# Patient Record
Sex: Male | Born: 1935 | Race: White | Hispanic: No | Marital: Married | Smoking: Former smoker
Health system: Southern US, Community
[De-identification: ages and names within clinical notes are randomized; demographics above are authoritative.]

## PROBLEM LIST (undated history)

## (undated) DIAGNOSIS — C649 Malignant neoplasm of unspecified kidney, except renal pelvis: Secondary | ICD-10-CM

## (undated) DIAGNOSIS — I219 Acute myocardial infarction, unspecified: Secondary | ICD-10-CM

## (undated) DIAGNOSIS — F32A Depression, unspecified: Secondary | ICD-10-CM

## (undated) DIAGNOSIS — K219 Gastro-esophageal reflux disease without esophagitis: Secondary | ICD-10-CM

## (undated) DIAGNOSIS — F0390 Unspecified dementia without behavioral disturbance: Secondary | ICD-10-CM

## (undated) DIAGNOSIS — F329 Major depressive disorder, single episode, unspecified: Secondary | ICD-10-CM

## (undated) HISTORY — PX: APPENDECTOMY: SHX54

## (undated) HISTORY — PX: HEMORRHOID SURGERY: SHX153

## (undated) HISTORY — PX: OTHER SURGICAL HISTORY: SHX169

## (undated) HISTORY — PX: CORONARY STENT PLACEMENT: SHX1402

## (undated) HISTORY — PX: VASECTOMY: SHX75

## (undated) HISTORY — PX: NEPHRECTOMY: SHX65

## (undated) HISTORY — PX: CORONARY ARTERY BYPASS GRAFT: SHX141

---

## 1998-02-25 ENCOUNTER — Encounter (HOSPITAL_COMMUNITY): Admission: RE | Admit: 1998-02-25 | Discharge: 1998-05-26 | Payer: Self-pay | Admitting: Interventional Cardiology

## 1998-05-27 ENCOUNTER — Encounter (HOSPITAL_COMMUNITY): Admission: RE | Admit: 1998-05-27 | Discharge: 1998-08-25 | Payer: Self-pay | Admitting: Interventional Cardiology

## 1998-08-26 ENCOUNTER — Encounter (HOSPITAL_COMMUNITY): Admission: RE | Admit: 1998-08-26 | Discharge: 1998-11-24 | Payer: Self-pay | Admitting: Interventional Cardiology

## 2000-02-15 ENCOUNTER — Encounter: Payer: Self-pay | Admitting: Internal Medicine

## 2000-02-15 ENCOUNTER — Encounter: Admission: RE | Admit: 2000-02-15 | Discharge: 2000-02-15 | Payer: Self-pay | Admitting: Internal Medicine

## 2000-05-13 ENCOUNTER — Encounter: Payer: Self-pay | Admitting: Thoracic Surgery

## 2000-05-13 ENCOUNTER — Encounter: Admission: RE | Admit: 2000-05-13 | Discharge: 2000-05-13 | Payer: Self-pay | Admitting: Thoracic Surgery

## 2002-05-19 ENCOUNTER — Emergency Department (HOSPITAL_COMMUNITY): Admission: EM | Admit: 2002-05-19 | Discharge: 2002-05-19 | Payer: Self-pay | Admitting: Emergency Medicine

## 2003-04-02 ENCOUNTER — Ambulatory Visit (HOSPITAL_COMMUNITY): Admission: RE | Admit: 2003-04-02 | Discharge: 2003-04-02 | Payer: Self-pay | Admitting: Gastroenterology

## 2003-04-02 ENCOUNTER — Encounter (INDEPENDENT_AMBULATORY_CARE_PROVIDER_SITE_OTHER): Payer: Self-pay | Admitting: Specialist

## 2003-07-01 ENCOUNTER — Emergency Department (HOSPITAL_COMMUNITY): Admission: EM | Admit: 2003-07-01 | Discharge: 2003-07-01 | Payer: Self-pay | Admitting: Emergency Medicine

## 2003-08-01 ENCOUNTER — Emergency Department (HOSPITAL_COMMUNITY): Admission: EM | Admit: 2003-08-01 | Discharge: 2003-08-01 | Payer: Self-pay | Admitting: Emergency Medicine

## 2004-04-06 ENCOUNTER — Emergency Department (HOSPITAL_COMMUNITY): Admission: EM | Admit: 2004-04-06 | Discharge: 2004-04-06 | Payer: Self-pay | Admitting: Emergency Medicine

## 2004-09-29 ENCOUNTER — Encounter: Admission: RE | Admit: 2004-09-29 | Discharge: 2004-09-29 | Payer: Self-pay | Admitting: Internal Medicine

## 2004-10-20 ENCOUNTER — Inpatient Hospital Stay (HOSPITAL_COMMUNITY): Admission: EM | Admit: 2004-10-20 | Discharge: 2004-10-24 | Payer: Self-pay | Admitting: Emergency Medicine

## 2004-11-09 ENCOUNTER — Ambulatory Visit (HOSPITAL_COMMUNITY): Admission: RE | Admit: 2004-11-09 | Discharge: 2004-11-10 | Payer: Self-pay | Admitting: Interventional Cardiology

## 2004-11-27 ENCOUNTER — Encounter (HOSPITAL_COMMUNITY): Admission: RE | Admit: 2004-11-27 | Discharge: 2005-02-25 | Payer: Self-pay | Admitting: Interventional Cardiology

## 2005-02-02 ENCOUNTER — Encounter: Admission: RE | Admit: 2005-02-02 | Discharge: 2005-02-02 | Payer: Self-pay | Admitting: Internal Medicine

## 2005-02-06 ENCOUNTER — Encounter: Admission: RE | Admit: 2005-02-06 | Discharge: 2005-02-06 | Payer: Self-pay | Admitting: Internal Medicine

## 2005-11-26 DIAGNOSIS — C649 Malignant neoplasm of unspecified kidney, except renal pelvis: Secondary | ICD-10-CM

## 2005-11-26 HISTORY — DX: Malignant neoplasm of unspecified kidney, except renal pelvis: C64.9

## 2006-01-24 ENCOUNTER — Ambulatory Visit (HOSPITAL_COMMUNITY): Admission: RE | Admit: 2006-01-24 | Discharge: 2006-01-24 | Payer: Self-pay | Admitting: Urology

## 2006-03-11 ENCOUNTER — Inpatient Hospital Stay (HOSPITAL_COMMUNITY): Admission: RE | Admit: 2006-03-11 | Discharge: 2006-03-14 | Payer: Self-pay | Admitting: Urology

## 2006-03-11 ENCOUNTER — Encounter (INDEPENDENT_AMBULATORY_CARE_PROVIDER_SITE_OTHER): Payer: Self-pay | Admitting: Specialist

## 2006-09-09 ENCOUNTER — Ambulatory Visit (HOSPITAL_COMMUNITY): Admission: RE | Admit: 2006-09-09 | Discharge: 2006-09-09 | Payer: Self-pay | Admitting: Urology

## 2007-03-13 ENCOUNTER — Ambulatory Visit (HOSPITAL_COMMUNITY): Admission: RE | Admit: 2007-03-13 | Discharge: 2007-03-13 | Payer: Self-pay | Admitting: Urology

## 2007-08-27 ENCOUNTER — Emergency Department (HOSPITAL_COMMUNITY): Admission: EM | Admit: 2007-08-27 | Discharge: 2007-08-27 | Payer: Self-pay | Admitting: Emergency Medicine

## 2008-01-07 ENCOUNTER — Encounter: Admission: RE | Admit: 2008-01-07 | Discharge: 2008-01-07 | Payer: Self-pay | Admitting: Interventional Cardiology

## 2008-03-19 ENCOUNTER — Ambulatory Visit (HOSPITAL_COMMUNITY): Admission: RE | Admit: 2008-03-19 | Discharge: 2008-03-19 | Payer: Self-pay | Admitting: Urology

## 2008-08-18 ENCOUNTER — Encounter: Admission: RE | Admit: 2008-08-18 | Discharge: 2008-08-18 | Payer: Self-pay | Admitting: Internal Medicine

## 2008-09-22 ENCOUNTER — Ambulatory Visit (HOSPITAL_COMMUNITY): Admission: RE | Admit: 2008-09-22 | Discharge: 2008-09-22 | Payer: Self-pay | Admitting: Urology

## 2008-11-03 ENCOUNTER — Ambulatory Visit (HOSPITAL_COMMUNITY): Admission: RE | Admit: 2008-11-03 | Discharge: 2008-11-04 | Payer: Self-pay | Admitting: Otolaryngology

## 2008-11-03 ENCOUNTER — Encounter (INDEPENDENT_AMBULATORY_CARE_PROVIDER_SITE_OTHER): Payer: Self-pay | Admitting: Otolaryngology

## 2008-12-22 ENCOUNTER — Encounter: Admission: RE | Admit: 2008-12-22 | Discharge: 2008-12-22 | Payer: Self-pay | Admitting: Internal Medicine

## 2010-02-16 ENCOUNTER — Ambulatory Visit (HOSPITAL_COMMUNITY): Admission: RE | Admit: 2010-02-16 | Discharge: 2010-02-16 | Payer: Self-pay | Admitting: Urology

## 2010-02-21 ENCOUNTER — Emergency Department (HOSPITAL_COMMUNITY): Admission: EM | Admit: 2010-02-21 | Discharge: 2010-02-21 | Payer: Self-pay | Admitting: Emergency Medicine

## 2011-04-10 NOTE — Op Note (Signed)
[name], [name]              ACCOUNT NO.:  [id number]   MEDICAL RECORD NO.:  [id number]          PATIENT TYPE:  OIB   LOCATION:  5123                         FACILITY:  [facility]   PHYSICIAN:  [name], M.D. DATE OF BIRTH:  1926/01/02   DATE OF PROCEDURE:  11/03/2008  DATE OF DISCHARGE:                               OPERATIVE REPORT   PREOPERATIVE DIAGNOSIS:  Bilateral frontal sinus mucocele.   POSTOPERATIVE DIAGNOSIS:  Right frontal sinus mucocele.   SURGERY:  Bilateral endoscopic frontal sinus exploration.  Bilateral  endoscopic anterior ethmoidectomy.  Bilateral endoscopic antrostomy.   SURGEON:  Gloris Manchester. Wolicki, MD   ANESTHESIA:  General orotracheal.   BLOOD LOSS:  25 mL   COMPLICATIONS:  None.   FINDINGS:  A slight high anterior leftward septal deviation.  On the  right side, bulging in the anterior agger nasi region which is oriented  on the Stealth apparatus at the site of the mucocele.  Upon opening  this, extraction of approximately 20 mL of mucoid liquid and inspissated  pus.  With the irrigation no additional material produced.  A wide  frontal opening generated on the right side.  On exploration of the left  side, a wide opening into a small aerated frontal sinus pocket which on  Stealth orientation corresponded with the left frontal sinus.  On  careful exploration using a seeker probe, anterior and medial to the  left frontal sinus a small opening was generated into an air containing  space.  I was unable to enlarge this as the bone was quite thick.  Irrigating through the small hole with sterile saline, the fluid  egressed through the sinusotomy on the right side, suggesting that the  apparent bilateral mucoceles were in fact one large frontal sinus  presenting in both the right and left forehead and communicating through  the right frontal recess.  No evident disease in the anterior ethmoids  or maxillary sinuses on either side.  Softening of the  lamina papyracea  on the left side with bulging and orbital compression but no visible  periorbita and absolutely no visible fat.   PROCEDURE:  With the patient in a comfortable supine position, general  orotracheal anesthesia was induced without difficulty.  At an  appropriate level, the table was turned 90 degrees and the patient  placed in a very slight reverse Trendelenburg.  A saline moistened  throat pack was placed.  Nasal vibrissae were trimmed.   The patient had received preoperative Afrin spray.  At this point, 1%  Xylocaine with 1:100,000 epinephrine was infiltrated into the sub  mucoperichondrial plane of the septum on both sides and into the high  lateral wall of the nose.  Additional 1.5 x 3 inch cottonoids were  moistened with Afrin and placed between the turbinates and the septum on  both sides.  A sterile preparation and draping in the midface was  accomplished after implementing the Stealth apparatus.   The materials were removed from the nose and observed to be intact and  correct in number.  Using the headlight, the nose was more thoroughly  inspected.  Xylocaine 1% with 1:100,000 epinephrine, 4 mL total was  infiltrated into the lateral wall of the nose at the middle meatus, and  into the middle turbinate on both sides.  Additional Afrin moistened  pledgets were placed into the middle meatus on each side, and between  the middle turbinate and septum on both sides.  Several additional  minutes were allowed this to take effect.   Once again the materials were removed from the right side of the nose  and observed to be intact and correct in number.  Using a 0 degree nasal  endoscope, the findings were as described above.  The middle turbinate  was gently medialized, the uncinate process was identified, and incised  at its base.  It was lysed partially at the base using a straight punch  forceps and then the uncinate process was removed using the power  debrider.   The anterior face of the bulla ethmoidalis was identified and  opened with a punch and then cleaned further with the debrider.  The  vertical lamella of the middle turbinate was not disturbed.  With  probing, the natural antrostomy was identified and opened posterior and  anterior and the edges trimmed with the power debrider.  This identified  the level of the lamina papyracea.  Dissection bluntly with a suction  tip and with a punch forceps opened some agger nasi cells.  There was a  bulging mucus filled sac in the nasofrontal recess region.  The Stealth  apparatus was oriented and placed in this location and it was observed  to be the frontal sinus.  It was opened.  Some clear mucoid material was  removed following which some relatively adherent inspissated mucus was  evacuated.  The sinus was irrigated with 40 mL of sterile saline.  Bone  chips and mucous membranes were cleaned away from the opening but the  sinus itself was not further implemented.  Using the Stealth curved  suction, attempts were made to decide that this was communicating with  the left forehead and this was not possible owing to the geometry.  With  compression of the orbit, there was no violation of the lamina  papyracea.  This completed the right side.   On the left side, the materials were removed and observed to be intact  and correct in number.  Once again, the turbinate was medialized, the  uncinate process was incised and debrided and the anterior face of the  bulla was opened and then the bulla was debrided using the power  debrider.  The antrostomy was identified and opened posteriorly and  anteriorly and the edges were trimmed with the debrider.  Upon  identifying the lamina papyracea, the dissection was carried forward  into the agger nasi cells and once again there was a wide and easily  identified opening into what appeared to be a frontal sinus chamber.  With Stealth orientation, this was observed to  be the previously aerated  left frontal sinus which was quite asymmetrically smaller.  This did not  enter the apparent opacified left forehead space.  With careful  inspection no opening could be identified.  Using the seeker tip, a  roughly 1-mm opening was identified anterior and medial to the frontal  sinus and it was easily probed with no egress of fluid or mucus.  However, between its location and the dense bone surrounding it, I could  not open it any further.   Using the curved suction tip,  I lodged this at the opening and with  irrigation, egress of the fluid was observed in the right nasofrontal  recess.  This identified the left forehead cavity as being communicating  with the right side and no further attention to this opening was  required.  Small amounts of mucus and bone chips were carefully removed.  The specimen was identified separately for left and right sides.  At  this point, the nasal cavity was suctioned clear on both sides.  Compression of the left orbit identified.  Some mobility of the lamina  papyracea but there was intact bone and no evidence of herniated fat.   A triple thickness Telfa pack impregnated with bacitracin ointment and  with a 2-0 silk tag was placed in the middle meatus on each side for  hemostasis.  The nose and pharynx were suctioned clear.  The patient was  cleaned and drip pad was applied.  The patient was returned to  Anesthesia, awakened, extubated, and transferred to recovery in stable  condition.   COMMENT:  An 77 year old white male with persistent frontal sinus  opacification headaches, but no significant nasal drainage was  indication for today's procedure.  Anticipate routine postoperative  recovery with pack removal at 1 day and institution of nasal hygiene  measures.  Given his age and some preexisting cardiac conditions, we  will observe him 23 hours extended recovery.      Gloris Manchester. Lazarus Sutton, M.D.  Electronically  Signed     KTW/MEDQ  D:  11/03/2008  T:  11/03/2008  Job:  [id number]   cc:   Theressa Millard, M.D.  Lyn Records, M.D.

## 2011-04-13 NOTE — H&P (Signed)
NAMEMAYFORD, ALBERG              ACCOUNT NO.:  [id number]   MEDICAL RECORD NO.:  [id number]          PATIENT TYPE:  INP   LOCATION:  1419                         FACILITY:  [facility]   PHYSICIAN:  Heloise Purpura, MD      DATE OF BIRTH:  1926-08-26   DATE OF ADMISSION:  03/11/2006  DATE OF DISCHARGE:                                HISTORY & PHYSICAL   CHIEF COMPLAINT:  Right renal mass.   HISTORY:  [name] is a 77 year old gentleman who was found to have an  incidentally detected 1.2 cm right renal mass on a chest CT scan during a  hospitalization for a cardiac event.  He subsequently recovered from his  hospitalization and his performance status did significantly improve.  On  followup surveillance imaging, this mass was noted to increase in size to 2-  3 cm.  It was clearly seen to be enhancing and worrisome for a possible  renal malignancy.  A complete metastatic evaluation was performed which was  negative.  Due to the interval growth in the size of the mass, the patient  was counseled regarding management options and did wish to proceed with  surgical removal.  He therefore underwent a complete cardiac evaluation by  his cardiologist, Dr. Garnette Scheuermann and was felt to be an acceptable surgical  risk.  Surgical options were discussed with the patient and he did elect to  proceed with a laparoscopic partial nephrectomy.   PAST MEDICAL HISTORY:  Coronary artery disease status post myocardial  infarction in 1991.  He did have intervention with both angioplasty and  cardiac stents in 2005.  He also had a coronary artery bypass graft in 1997  as well as another angioplasty in 1991.  In addition, he has a history of  osteoarthritis, irritable bowel syndrome, and a history of a basal cell skin  cancer.   PAST SURGICAL HISTORY:  1.  Coronary artery bypass grafting in 1997.  2.  Hemorrhoidectomy.  3.  Laparoscopic appendectomy.  4.  Nasoplasty.   MEDICATIONS:  Benicar, Aciphex, and  Prozac.   ALLERGIES:  NO KNOWN DRUG ALLERGIES.   FAMILY HISTORY:  No history of GU malignancy.   SOCIAL HISTORY:  The patient is a retired [employer] fund[employer].  He is married.  He did  smoke one pack of cigarettes per day but quit some time ago.   PHYSICAL EXAMINATION:  CONSTITUTIONAL:  The patient is a well-nourished and  well-developed age-appropriate male in no acute distress.  CARDIOVASCULAR:  Regular rate and rhythm with some interval dropped beats.  No obvious murmurs.  LUNGS:  Clear bilaterally.  ABDOMEN:  Soft, nontender, nondistended, without abdominal masses.  BACK:  No CVA tenderness.  EXTREMITIES:  No edema.   IMPRESSION:  Right renal mass worrisome for malignancy.   PLAN:  [name] will be taken to the operating room and will undergo a  planned laparoscopic partial nephrectomy.  He then will be admitted to the  hospital for routine postoperative care.           ______________________________  Heloise Purpura, MD  Electronically Signed  LB/MEDQ  D:  03/11/2006  T:  03/11/2006  Job:  [id number]

## 2011-04-13 NOTE — Discharge Summary (Signed)
[name], [name]              ACCOUNT NO.:  [id number]   MEDICAL RECORD NO.:  [id number]          PATIENT TYPE:  INP   LOCATION:  1419                         FACILITY:  [facility]   PHYSICIAN:  Heloise Purpura, MD      DATE OF BIRTH:  Jan 11, 1926   DATE OF ADMISSION:  03/11/2006  DATE OF DISCHARGE:  03/14/2006                                 DISCHARGE SUMMARY   ADMISSION DIAGNOSIS:  Right renal mass.   DISCHARGE DIAGNOSIS:  Clear-cell renal cell carcinoma.   HISTORY AND PHYSICAL:  For full details, please see admission history and  physical. Briefly, Mr. Cafiero is a 77 year old gentleman who was found to  have an incidentally detected enhancing right renal mass. This was  subsequently seen to be increasing in size on follow-up imaging. The patient  initially was hospitalized due to his cardiac disease. Subsequently, his  performed status improved, and he was felt to be a potential surgical  candidate. After undergoing a negative metastatic evaluation and a  discussion regarding management options for a clinically localized enhancing  renal mass, the patient elected to proceed with surgical therapy. He did  undergo a cardiac evaluation by Dr. Garnette Scheuermann which was determined to place  the patient as an acceptable risk for surgical treatment of his renal mass.   HOSPITAL COURSE:  The patient was admitted to the hospital on March 11, 2006. He then underwent a laparoscopic right radical nephrectomy. Initially,  the plan was to proceed with a partial nephrectomy. However, due to the  patient's labile blood pressure intraoperatively and very dense adherent  perinephric fat, it was decided to proceed with a radical nephrectomy as  this was felt to be the safest option for the patient. The procedure was  otherwise uneventful, and he was able to be transferred to a regular  hospital room following recovery from anesthesia. Over the course of the  next couple of days, he was able to begin  ambulating which he did without  difficulty. He was maintained on a cardiac monitor during his hospital stay.  He did not have any cardiac events and specifically denied chest pain and  had no evidence of arrhythmias on his telemetry monitoring. Over the course  of the first few postoperative days, his diet was able to be gradually  advanced. He did have return of bowel function. By postoperative day #3, he  was tolerating a regular diet and able to ambulate without difficulty. His  pain was well controlled on oral pain medication. His serum creatinine on  postoperative day #3 was 1.4. By the morning of postoperative day #3, he had  met all discharge criteria and was discharged home in excellent condition.   DISPOSITION:  Home.   DISCHARGE MEDICATIONS:  The patient was instructed to resume his regular  home medications. In addition, he was maintained on Lopressor  postoperatively. He was given a prescription to continue this medication at  12.5 mg p.o. b.i.d. He was also given a prescription for Vicodin as needed  for pain and told to take Colace as a stool softener.   DISCHARGE  INSTRUCTIONS:  The patient was instructed to be ambulatory but  specifically instructed to refrain from any heavy lifting or strenuous  activity for a period of six weeks. He was told that he should not drive for  approximately one month. In addition, he was instructed on the signs and  symptoms of wound infection and told to call should he have any problems.   FOLLOW UP:  Mr. Tino will follow up with me in the next 10-14 days for  removal of the staples and for a postoperative checkup. I have also  instructed the patient to schedule an appointment with Dr. Garnette Scheuermann within  the next two months to determine whether he should continue his beta blocker  or if he may discontinue this medication at that time.           ______________________________  Heloise Purpura, MD  Electronically Signed     LB/MEDQ   D:  03/14/2006  T:  03/15/2006  Job:  [id number]   cc:   Lyn Records, M.D.  Fax: [phone]

## 2011-04-13 NOTE — Op Note (Signed)
   [name], POTASH                          ACCOUNT NO.:  [id number]   MEDICAL RECORD NO.:  [id number]                   PATIENT TYPE:  AMB   LOCATION:  ENDO                                 FACILITY:  [facility]   PHYSICIAN:  Danise Edge, M.D.                DATE OF BIRTH:  1926/06/13   DATE OF PROCEDURE:  04/02/2003  DATE OF DISCHARGE:  04/02/2003                                 OPERATIVE REPORT   REFERRING PHYSICIAN:  Theressa Millard, M.D.   INDICATIONS FOR PROCEDURE:  The patient is a 77 year old male born on  November 11, 1926.  The patient has alternating constipation leading to  nonbloody diarrhea. He is scheduled to undergo a diagnostic and also  screening colonoscopy with polypectomy to prevent colon cancer.   ENDOSCOPIST:  Danise Edge, M.D.   PREMEDICATION:  Versed 10 mg, Demerol 70 mg.   DESCRIPTION OF PROCEDURE:  After obtaining informed consent, the patient was  placed in the left lateral decubitus position.  I administered intravenous  Demerol and intravenous Versed to achieve conscious sedation for the  procedure.  The patient's blood pressure, oxygen saturation, and cardiac  rhythm were monitored throughout the procedure and documented in the medical  record.   Anal inspection was normal.  Digital rectal examination was normal.  The  Olympus adult colonoscope was introduced into the rectum and advanced to the  cecum. Colonic preparation for the examination today was satisfactory.   Rectum normal.   Sigmoid colon and descending colon:  At 40 cm from the anal verge, a 1 mm  sessile polyp was removed with the hot biopsy forceps.   Splenic flexure normal.   Transverse colon normal.   Hepatic flexure normal.   Ascending colon normal.   Cecum and ileocecal valve normal.    ASSESSMENT:  From the sigmoid colon at 40 cm in the anal verge, a 1 mm  sessile polyp was removed with the hot biopsy forceps; otherwise normal  screening proctocolonoscopy to the  cecum.                                               Danise Edge, M.D.    MJ/MEDQ  D:  04/02/2003  T:  04/05/2003  Job:  [id number]   cc:   Theressa Millard, M.D.  [address] [place]  [place] [address]  Fax: [phone]

## 2011-04-13 NOTE — H&P (Signed)
NAMEJARRYD, Sutton              ACCOUNT NO.:  [id number]   MEDICAL RECORD NO.:  [id number]          PATIENT TYPE:  EMS   LOCATION:  MAJO                         FACILITY:  [facility]   PHYSICIAN:  Ulyses Amor, MD DATE OF BIRTH:  July 04, 1926   DATE OF ADMISSION:  10/20/2004  DATE OF DISCHARGE:                                HISTORY & PHYSICAL   HISTORY OF PRESENT ILLNESS:  [name] is a 77 year old white man  who is admitted to [facility] for further evaluation of chest pain.   The patient has a history of cardiac disease which dates back to 67.  At  that time, he suffered a myocardial infarction and underwent coronary artery  bypass surgery.  His course has been uncomplicated since then.  He has not  experienced recurrent angina.   The patient presented to the emergency department after experiencing an  episode of chest pain.  This occurred at home while he was seated.  The  chest pain was described as a pressure and an ache in the lower substernal  region.  It did not radiate.  It was not associated with dyspnea,  diaphoresis or nausea.  There were no exacerbating or ameliorating factors.  It appeared not be related to position, activity, meals or respirations.  He  took two nitroglycerin tablets at home which did not relieve the chest pain.  EMS was summoned, and he was transported to the emergency department.  He  was given additional nitroglycerin en route.  His chest pain had largely  resolved by the time that he had arrived in the emergency department.  He is  free of chest pain and otherwise asymptomatic at this time.   There is no history of congestive heart failure.  The patient is known to  have an ejection fraction of approximately 40% by echocardiogram.  There is  no history of arrhythmia.   There is no history of hypertension, diabetes mellitus or dyslipidemia.  The  patient smoked in the remote past.  There is a family history of early  coronary artery disease.  His father suffered from coronary artery disease  in his early 58's.   In addition to the aforementioned problems, the patient has a history of  gastroesophageal reflux disease and depression.   CURRENT MEDICATIONS:  1.  Aciphex 20 mg p.o. daily.  2.  Fluoxetine 20 mg p.o. daily.  3.  Accupril 20 mg daily.  4.  Aspirin 325 mg p.o. daily.   ALLERGIES:  He is not allergic to any medications.   OPERATIONS:  Coronary artery bypass surgery as noted above, as well as  appendectomy.   SIGNIFICANT INJURIES:  None.   SOCIAL HISTORY:  The patient is retired. He lives with his wife. He never  smokes nor drinks.   REVIEW OF SYSTEMS:  Reveals no new problems related to his head, eyes, ears,  nose, mouth, throat, lungs, gastrointestinal system, genitourinary system or  extremities.  There is no history of neurological or psychiatric disorder.  There is no history of fever, chills, or weight loss.   PHYSICAL EXAMINATION:  VITAL SIGNS:  Blood pressure 118/61.  Pulse 84 and  regular.  Respirations 20.  Temperature 98.6.  GENERAL:  The patient is a middle-aged white man, in no discomfort.  He was  alert, oriented, appropriate, and responsive.  HEENT:  Head, eyes, nose and mouth were normal.  NECK:  Without thyromegaly or adenopathy.  Carotid pulses were palpable  bilaterally without bruits.  CARDIAC:  Examination revealed a normal S1 and S2.  There was no S3, S4,  murmur, rub, or click.  Cardiac rhythm was regular.  No chest wall  tenderness was noted.  LUNGS:  Clear.  ABDOMEN:  Soft, nontender.  There was no mass, hepatosplenomegaly, bruits,  distension, rebound, guarding, or rigidity.  Bowel sounds were normal.  RECTAL/GENITAL:  Examinations were not performed as they were not pertinent  to the reason for acute-care hospitalization.  EXTREMITIES:  Without edema, deviation or deformity.  Radial and dorsalis  pedis pulses were palpable bilaterally.  NEUROLOGIC:   Brief screening neurologic survey was unremarkable.   Electrocardiogram demonstrates normal sinus rhythm, a prior septal  infarction, and nonspecific ST-T abnormalities in the lateral leads.   The chest radiograph, according to the radiologist, demonstrated  cardiomegaly with evidence of prior coronary artery bypass surgery.  White  count was 7.4 with hemoglobin of 14.3 and hematocrit of 41.2.  BUN was 16,  creatinine 0.7 and potassium 4.2.  The initial set of cardiac markers  revealed a myoglobin of 53.5, CK-MB of 2.5, and troponin less than 0.05.   IMPRESSION:  1.  Chest pain, rule out unstable angina.  2.  Coronary artery disease, status post myocardial infarction and coronary      artery bypass surgery in 1997.  3.  Mild left ventricular dysfunction without congestive heart failure.      Ejection fraction estimated to be approximately 40% by echocardiogram.  4.  Gastroesophageal reflux.  5.  Depression.   PLAN:  1.  Telemetry.  2.  Serial cardiac enzymes.  3.  Aspirin.  4.  Lovenox.  5.  Intravenous nitroglycerin.  6.  Metoprolol.  7.  Further measures per Dr. Katrinka Blazing      [facility]   MSC/MEDQ  D:  10/20/2004  T:  10/20/2004  Job:  [id number]   cc:   Lesleigh Noe, M.D.  [address]  [place]  [place] [address]  Fax: [phone]

## 2011-04-13 NOTE — Cardiovascular Report (Signed)
NAMEROLIN, SCHULT              ACCOUNT NO.:  [id number]   MEDICAL RECORD NO.:  [id number]          PATIENT TYPE:  OIB   LOCATION:  2899                         FACILITY:  [facility]   PHYSICIAN:  Lyn Records III, M.D.DATE OF BIRTH:  October 12, 1926   DATE OF PROCEDURE:  11/09/2004  DATE OF DISCHARGE:                              CARDIAC CATHETERIZATION   INDICATIONS:  High grade native RCA disease in the proximal mid and distal  vessel with tight PDA lesion proximal to PDA saphenous vein graft insertion  site leaving the LV branches of the right coronary threatened.   PROCEDURE PERFORMED:  1.  Taxus stent implantation in the proximal/mid left anterior descending      (one long stent).  2.  Distal right coronary artery Taxus stent deployment.   DESCRIPTION:  The patient was brought to the catheterization laboratory in  the postabsorptive state.  A 7-French sheath was started in the right  femoral artery using the modified Seldinger technique.  A 7-French side hole  guide catheter was used to obtain guiding shots.  Angiomax was used as the  antithrombotic.  The patient was already on Plavix from the previously  placed stent in the PDA via the saphenous vein graft to the PDA.  Angioplasty was performed over a medium Asahi wire.  A 2.5 x 15 mm long  Maverick balloon was used for pre dilatation of the proximal and mid RCA and  in using this balloon it was felt that a 30 mm segment of stent was needed  to cover the disease in the proximal and mid RCA.  We therefore deployed a  3.0 x 32 mm long Taxus stent to 14 atmospheres.  Two balloon inflations were  performed and a good result was achieved.  This was after we had directly  stented the distal RCA with a 2.75 x 16 Taxus stent to 11 atmospheres.  Two  balloon inflations were performed at that site and a nice angiographic  result was noted.  We then removed the wire and did final angiographic  imaging demonstrating widely patent distal RCA,  widely patent proximal and  mid RCA, this step-up proximally noted.  TIMI grade 3 flow is noted.  Reflux  into the PDA and saphenous vein graft is also noted.  AngioSeal arteriotomy  closure was performed using an 8-French device with good hemostasis.  Angiomax was discontinued in the catheterization laboratory.  The ACT prior  to intervention was greater than 300.   CONCLUSION:  Successful deployment of a 32 mm x 3.0 mm Taxus stent in the  proximal/mid right coronary artery and a 16 x 2.75 Taxus stent in the distal  right coronary artery with wide patency of native right coronary freeing  this territory of potential ischemia due to the high grade obstruction in  the posterior descending artery that supplies the native right coronary  retrograde via flow from the saphenous vein graft to the posterior  descending artery.   PLAN:  1.  Discharge November 10, 2004.  2.  Continue Plavix x1 year.       HWS/MEDQ  D:  11/09/2004  T:  11/09/2004  Job:  [id number]   cc:   Theressa Millard, M.D.  [address] [place]  [place] [address]  Fax: [phone]

## 2011-04-13 NOTE — Op Note (Signed)
NAMEPHILLIPE, [name]              ACCOUNT NO.:  [id number]   MEDICAL RECORD NO.:  [id number]          PATIENT TYPE:  INP   LOCATION:  1419                         FACILITY:  [facility]   PHYSICIAN:  Heloise Purpura, MD      DATE OF BIRTH:  Jan 04, 1926   DATE OF PROCEDURE:  03/11/2006  DATE OF DISCHARGE:                                 OPERATIVE REPORT   PREOPERATIVE DIAGNOSIS:  Right renal mass.   POSTOPERATIVE DIAGNOSIS:  Right renal mass.   PROCEDURE:  Right laparoscopic radical nephrectomy.   SURGEON:  Dr. Heloise Purpura.   ASSISTANT:  Dr. Marcine Matar   ANESTHESIA:  General.   COMPLICATIONS:  None.   ESTIMATED BLOOD LOSS:  250 mL.   INTRAVENOUS FLUIDS:  3500 mL of lactated Ringer's.   SPECIMEN:  Right kidney.   DRAINS:  16-French Foley catheter.   INDICATIONS:  [name] is a 77 year old patient who was found to have an  incidentally detected enhancing right renal mass worrisome for possible  malignancy approximately 1 year ago.  Initially this mass noted be 1.2 cm.  The patient subsequently has improved his overall performance status and  recovered from a cardiac standpoint.  In addition, his renal mass was found  to increase incised from 1.2 cm to between 2 and 3 cm on follow-up imaging.  Metastatic evaluation was performed which was negative.  After discussion  regarding management options for a enhancing renal mass, the patient elected  to proceed with surgical removal.  We did discuss performing a laparoscopic  partial nephrectomy which the patient did consent.  We discussed the risks  and benefits including the potential need for total nephrectomy and the  patient consented and agreed to proceed.   DESCRIPTION OF PROCEDURE:  The patient was taken to the operating room and  general anesthetic was administered.  He was given preoperative antibiotics,  placed in the modified right flank position, and prepped and draped in usual  sterile fashion.  A preoperative  time-out was performed.  A 10 mm Hasson  port was then placed.  Entry into the peritoneal cavity was performed via a  standard open Hasson technique.  0 Vicryl holding stitches were then placed  in the abdominal wall fascia which were secured to the 10 mm Hasson port.  A  pneumoperitoneum was established and the 30 degrees lens was then used to  inspect the abdomen.  There was no evidence of any intra-abdominal injuries.  There were noted to be a few small adhesions in the right upper quadrant.  The remaining ports were then placed.  A 12 mm port was placed between the  umbilicus and the xiphoid process just to the right of midline.  An  additional 12 mm port was placed in the right lower quadrant just lateral to  the rectus muscle.  The harmonic scalpel was then used to take down the  previously mentioned adhesions.  Finally, a 5 mm port was placed in the far  lateral right abdominal wall.  All ports were placed under direct vision  without difficulty.  The harmonic scalpel was  then used to mobilize the  ascending colon medially and the plane between the colonic mesentery and the  anterior layer of Gerota's fascia was developed.  The duodenum was then  identified and a Kocher maneuver was used to medially mobilize the duodenum  thereby exposing the inferior vena cava.  The space between the psoas fascia  and the overlying tissue which included the gonadal vein and ureter was then  developed allowing these structures to be lifted anteriorly.  Attention then  turned to the lateral aspect of the kidney which was dissected free from the  abdominal sidewall.  Superiorly, the fat overlying the kidney was noted to  be densely adherent to the liver.  This was carefully taken down.  During  this dissection, there was noted to be a small superficial laceration to the  liver.  This was not bleeding and did not require any immediate attention.  The kidney was then further mobilized.  Dissection then  continued from the  inferior aspect of the kidney toward the renal hilum along the inferior vena  cava.  A small renal artery was seen just inferior to the main renal vein.  There was also noted to be an additional renal artery just posterior to the  renal vein.  Then attention then turned to the anterior aspect of the  kidney.  The perinephric fat was incised with the harmonic scalpel.  This  fat was noted to be extremely dense and adherent to the renal parenchyma.  While this fat was able to be cleared off the renal capsule the inferior  aspect of the kidney, this did take an extended period of time.  Dissection  continued superiorly.  During the case, the patient's blood pressure was  noted to be somewhat labile and it was decided that for the safety of the  patient, and it would be best to proceed with a total nephrectomy rather  than to continue with a dissection of the perinephric fat off the kidney  which was extremely tedious and time-consuming.  The patient was noted to  have a normal contralateral kidney on his CT scan.  Therefore, the  previously mentioned renal arteries were identified and divided between  multiple hem-o-lock clips.  Similarly, the renal vein was divided between  multiple large hem-o-lok clips and divided.  The superior pole of the kidney  was then dissected free from the adrenal gland after Gerota's fascia was  entered.  There was noted to be a large vein superiorly that appeared to be  going from the kidney up toward the adrenal gland.  This was prospectively  identified and divided between hem-o-lock clips.  In addition, the ureter  and gonadal vein were isolated and divided between hem-o-lock clips.  The  remaining attachments of the kidney were then ligated with the Harmonic  scalpel and divided and the kidney was placed up onto the liver.  The renal fossa was then carefully examined.  There did not appear to be any bleeding  and hemostasis appeared  excellent.  FloSeal was then placed onto the  previously mentioned liver laceration and Surgicel was placed over this  area.  Attention then turned to placement of the closing stitches.  0 Vicryl  stitches were placed through the abdominal wall fascia of both the midline  superior and right lower quadrant, 12 mm port sites for later port site  closure.  The EndoCatch II bag was then placed through the port site just  superior to the umbilicus  after this after the [facility] port was removed.  The  kidney specimen was placed in this bag and this incision was extended in a  periumbilical fashion allowing the specimen to be removed intact within the  EndoCatch II bag.  This fascial opening was then closed with two running 0  Vicryl sutures.  The pneumoperitoneum was then reestablished and the right  renal fossa examined.  There did not appear to be any additional bleeding  and again appeared to be excellent hemostasis both in the renal fossa as  well as on the liver.  The 5 mm port was then removed.  The remaining ports  were then removed under direct vision.  The previously placed 0 Vicryl  sutures were then used to close the 12 mm port sites.  Quarter percent  Marcaine was used to injected into all incision sites which were  reapproximated at the skin with staples.  Sterile dressings were applied.  There were no complications.  The patient appeared to tolerate the procedure  well.  All sponge, needle counts were correct x2.  He was able to be  extubated and transferred to recovery in satisfactory condition.           ______________________________  Heloise Purpura, MD  Electronically Signed     LB/MEDQ  D:  03/11/2006  T:  03/12/2006  Job:  [id number]

## 2011-04-13 NOTE — Cardiovascular Report (Signed)
[name], [name]              ACCOUNT NO.:  [id number]   MEDICAL RECORD NO.:  [id number]          PATIENT TYPE:  OBV   LOCATION:  2018                         FACILITY:  [facility]   PHYSICIAN:  Lyn Records III, M.D.DATE OF BIRTH:  [birth year]   DATE OF PROCEDURE:  10/23/2004  DATE OF DISCHARGE:                              CARDIAC CATHETERIZATION   PROCEDURE:  Diagnostic left heart catheterization with selective left and  right coronary angiography, bypass graft angiography, left internal mammary  artery graft angiography, stent of the posterior descending coronary artery  via the saphenous vein graft to the posterior descending coronary artery  using a drug-eluting stent.   CARDIOLOGIST:  Lyn Records, M.D.   INDICATIONS FOR PROCEDURE:  Acute coronary syndrome, prior history of a  coronary artery bypass graft surgery.   DESCRIPTION OF PROCEDURE:  The patient was brought to the cardiac  catheterization laboratory after being admitted on October 20, 2004, for  symptoms compatible with unstable angina.  Biochemical markers were negative  for a myocardial infarction.  Diagnostic cardiac catheterization was felt to  be indicated, and was performed after the patient's consent.  After performing a coronary angiography, high-grade stenosis was identified  distal to the saphenous vein graft origin to the PDA.  This was felt to be a  potential cover for the patient's sudden onset and symptoms, perhaps with  plaque rupture, partial thrombosis and recannulation.  After discussing  potential percutaneous approaches with the patient, we decided to proceed.  The patient was given a total of 5500 units of IV heparin and a double bolus  followed by an infusion of Integrilin.  The ACT was documented to be greater  than 220 seconds.  We then used a 6-French right bypass graft guide catheter  and a BMW wire to perform the intervention.  We used a 2.0 mm Voyager  balloon, and then stented with  a 2.5 mm x 18.0 mm CYPHER.  Post-dilatation  was then performed with a 9 mm long Quantum 3.0 mm diameter balloon.  The  patient tolerated the procedure without significant complications.  He did  have substernal chest discomfort that was qualitatively different than that  which precipitated admission.  Post-procedure and angiogram, a sheathogram  was performed in the right femoral, and angiocele arteriotomy closure was  performed without complications.   RESULTS:  HEMODYNAMIC DATA  Aortic pressure:  108/60.  Left ventricular pressure:  113/15.  LEFT VENTRICULOGRAPHY  The left ventricle demonstrates left ventricular dysfunction.  Anteroapical  moderate to severe hypokinesis was noted.  The estimated ejection fraction  is in the 35%-45% range, closer to 45%.  No MR is noted.  CORONARY ANGIOGRAPHY  1.  LEFT MAIN CORONARY ARTERY:  The left main coronary artery has a proximal      25% narrowing, distal 30%-40% narrowing.  Calcification is noted.  2.  LEFT ANTERIOR DESCENDING CORONARY ARTERY:  The left anterior descending      coronary artery is occluded after the second diagonal and first septal      perforator.  3.  CIRCUMFLEX CORONARY ARTERY:  The circumflex coronary  artery is totally      occluded in the mid-vessel after the left atrial recurrent branch.  4.  RIGHT CORONARY ARTERY:  The native right coronary is patent, but      contains proximal/ostial 90% narrowing, mid-vessel 85%-90% narrowing and      distal 90%.  Competition for flow is noted in the continuation of the      RCA and also into the proximal third of the PDA.  BYPASS GRAFT ANGIOGRAPHY  1.  SAPHENOUS VEIN GRAFT TO THE OBTUSE MARGINALS:  This graft is widely      patent, but contains an eccentric 50% distal anastomosis stenosis.  2.  SAPHENOUS VEIN GRAFT TO THE DIAGONAL:  The saphenous vein graft to the      diagonal is patent.  A distal 25%-30% anastomosis stenosis is noted.  3.  SAPHENOUS VEIN GRAFT TO THE RIGHT  CORONARY ARTERY:  This graft is widely      patent.  The PDA contains an eccentric 90+% stenosis just distal to the      graft insertion site.  4.  LEFT INTERNAL MAMMARY GRAFT TO THE LEFT ANTERIOR DESCENDING CORONARY      ARTERY:  The subclavian contains a significant kink in the mid-vessel.      The LIMA is patent, but could not be directly cannulated.  No      obstruction is noted in the LIMA, and there is good distal runoff into      the mid to distal LAD without any evidence of significant obstruction.  PERCUTANEOUS CORONARY INTERVENTION:  The PDA distal to the graft insertion  site was pre-dilated, and then an 18 mm long x 2.5 mm wide CYPHER stent was  easily deployed to 13 atmospheres.  Pulse dilatation was performed with a 9  mm long x 3.0 mm wide Quantum balloon to 12 atmospheres in the proximal  third to one-half of the stent.  The patient tolerated the procedure, but  did have reproducible pain that did not completely mimic that which caused  admission.  Post-procedure there was continued good reflux of contrast into  the proximal PDA, and then to the continuation of the RCA.   CONCLUSION:  1.  Severe native vessel disease with a total occlusion of the left anterior      descending coronary artery and the circumflex, as outlined above.  There      are multiple high-grade lesions in the native right coronary artery.      The posterior descending coronary artery beyond the graft insertion site      is threatened with a 90+% stenosis beyond the graft insertion site, into      the proximal third of the posterior descending coronary artery.  2.  Left ventricular dysfunction with anteroapical moderate to severe      hypokinesis and an ejection fraction in the 35% to 45% range.  No mitral      regurgitation.  3.  Saphenous vein graft is widely patent.  There is a 50% distal      anastomosis stenosis in the saphenous vein graft to the obtuse marginal. 4.  Successful stent of the  posterior descending coronary artery via the      saphenous vein graft to the posterior descending coronary artery with      deployment of a CYPHER 2.5 mm x 18.0 mm stent to 0% stenosis with TIMI      grade 3 flow.  5.  Patent left internal mammary  artery to the left anterior descending      coronary artery graft, significant kinking and calcification in the left      subclavian preventing ostial engagement.   PLAN:  Aspirin and Plavix.  Consideration of deployment of a long stent in  the proximal/mid-native RCA and a separate more distal stent in the distal  RCA, to free up the circulation to the LV branches of the right coronary  artery, which are still undisturbed because of a 70%-80% stenosis in the  proximal PDA, proximal to the graft insertion site, and severe native vessel  disease, as outlined above.  Will discuss this with the patient, and may  consider doing this in one to two weeks, if all goes well.       HWS/MEDQ  D:  10/23/2004  T:  10/23/2004  Job:  [id number]   cc:   Theressa Millard, M.D.  [address] [place]  [place] [address]  Fax: [phone]

## 2011-07-04 IMAGING — CT CT HEAD W/O CM
2 series · 17 of 30 positions shown, 20 images · non-contrast
Comparison: Plain film of the nasal bone of 08/27/2007.

CLINICAL DATA: Fall with laceration to right orbit/nasal area.

CT HEAD WITHOUT CONTRAST
TECHNIQUE: Contiguous axial images were obtained from the base of
the skull through the vertex without contrast.

[Series 2: head_seq 4.5 h37s st · axial · 0.46mm/px · z∈[-100,+29]mm · 10 of 36 slices shown, 13 images]
[im 4/36  brain]
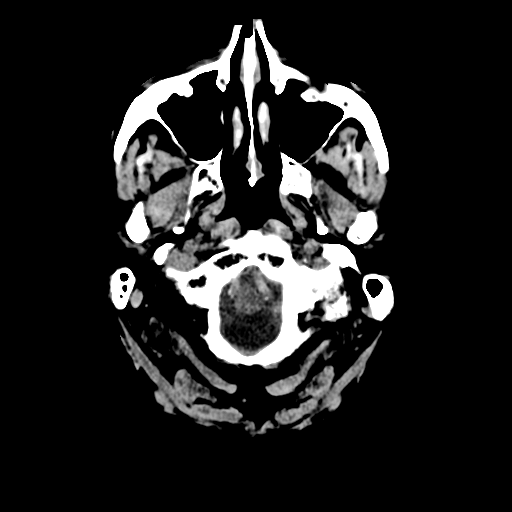
[im 4/36  bone]
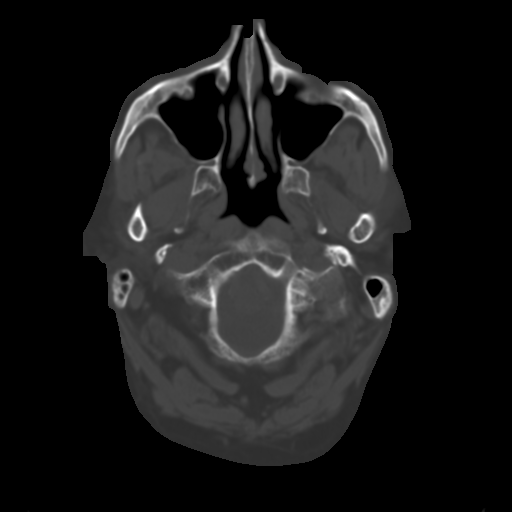
[im 7/36  brain]
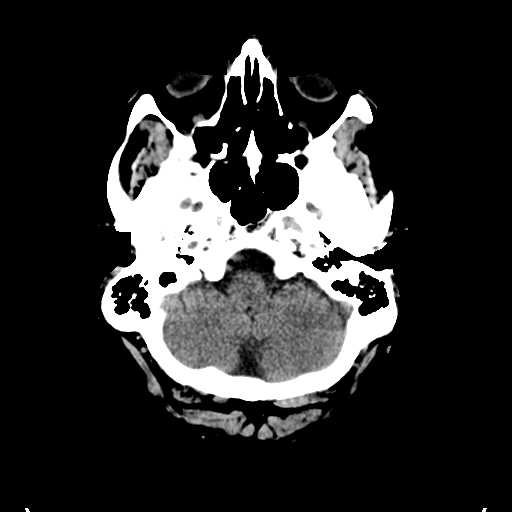
[im 10/36  brain]
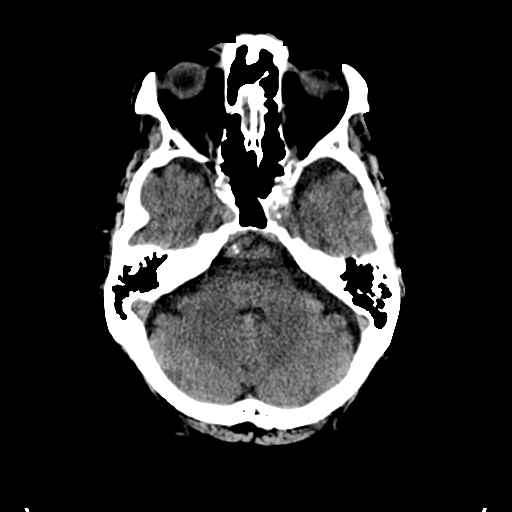
[im 13/36  brain]
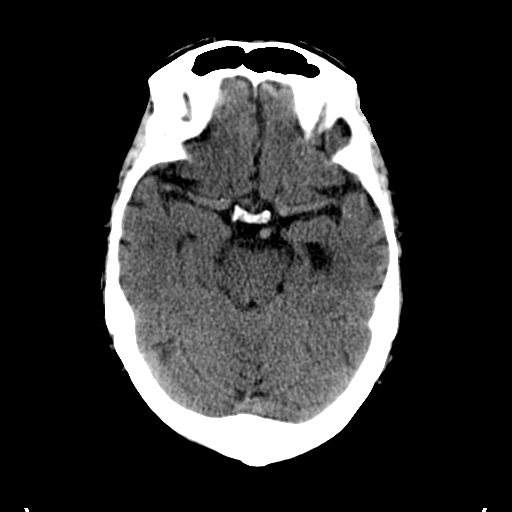
[im 16/36  brain]
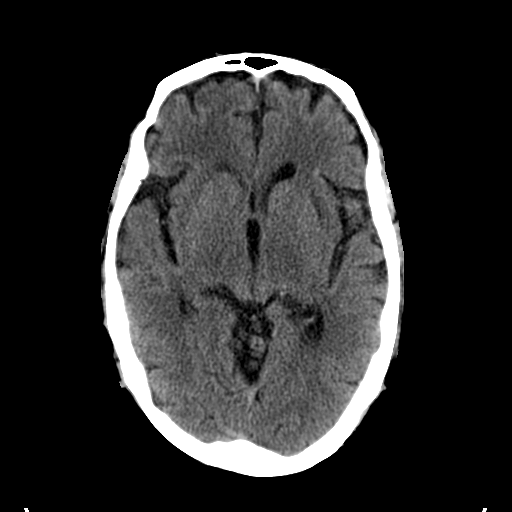
[im 16/36  bone]
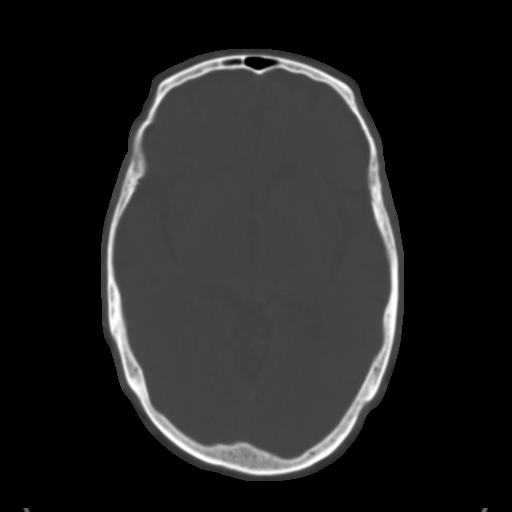
[im 20/36  brain]
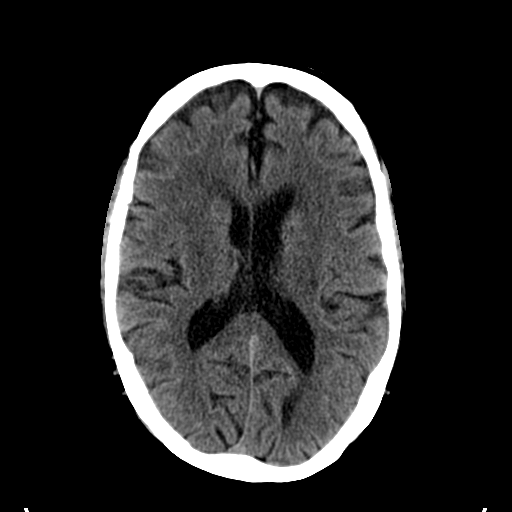
[im 23/36  brain]
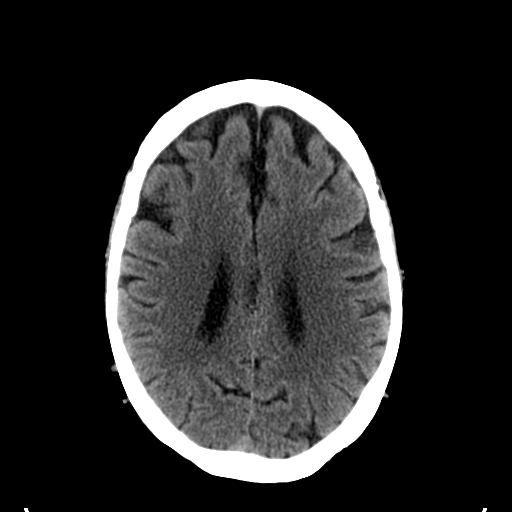
[im 26/36  brain]
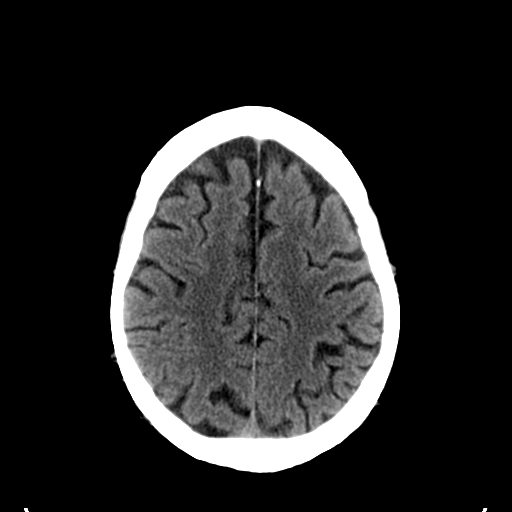
[im 29/36  brain]
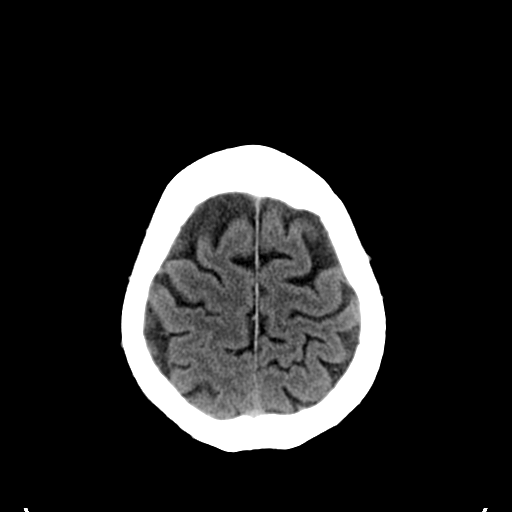
[im 29/36  bone]
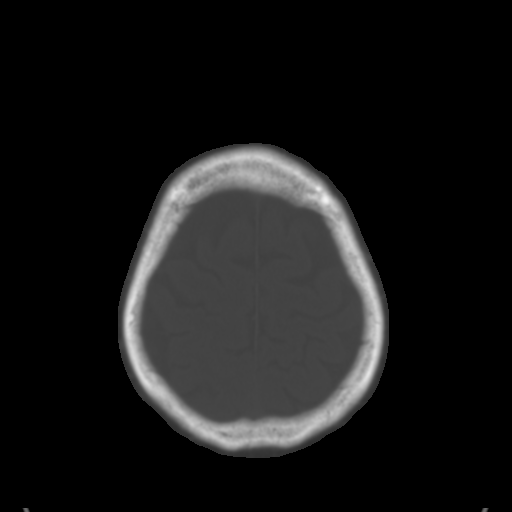
[im 32/36  brain]
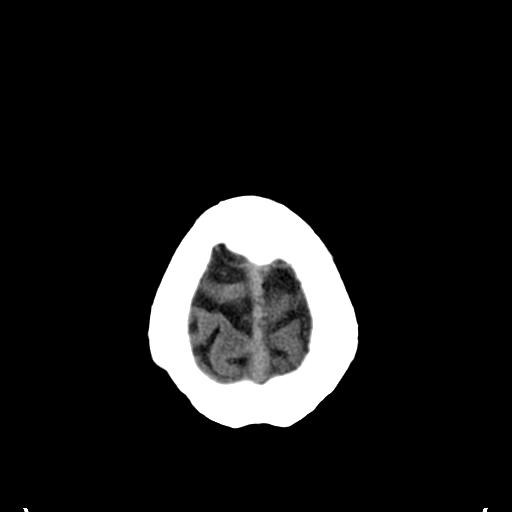

[Series 3: head_seq 3.0 h60s bone · axial · 0.46mm/px · z∈[-97,+18]mm · 7 of 54 slices shown]
[im 7/54  bone]
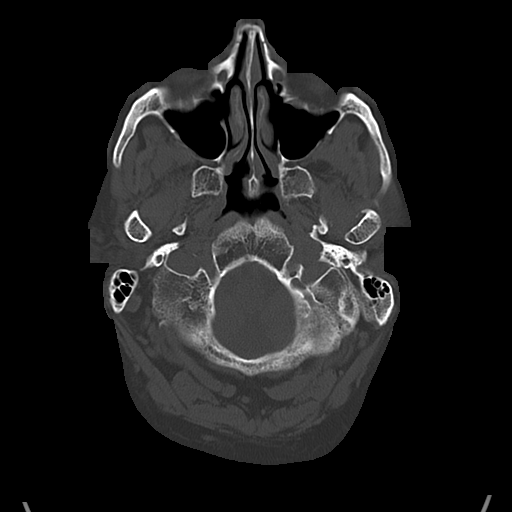
[im 13/54  bone]
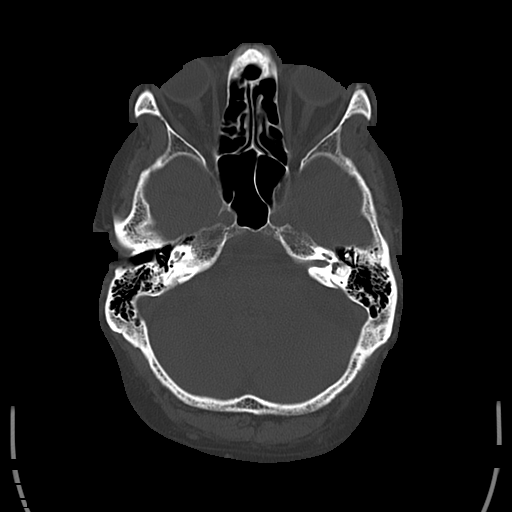
[im 19/54  bone]
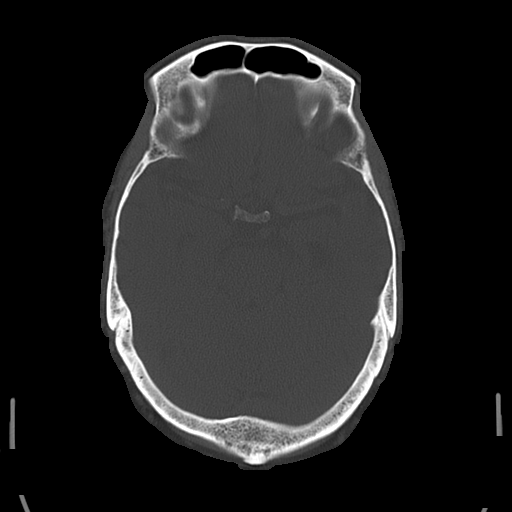
[im 25/54  bone]
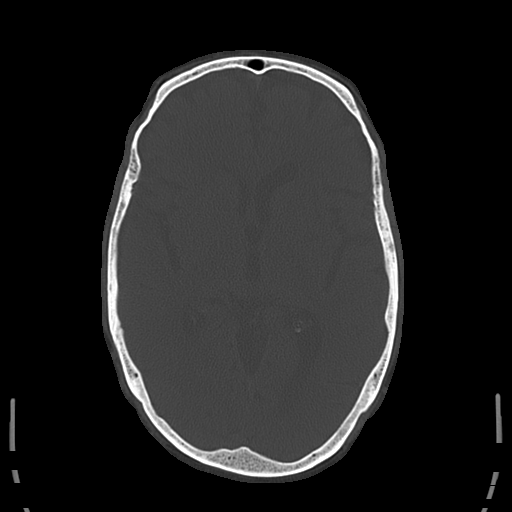
[im 32/54  bone]
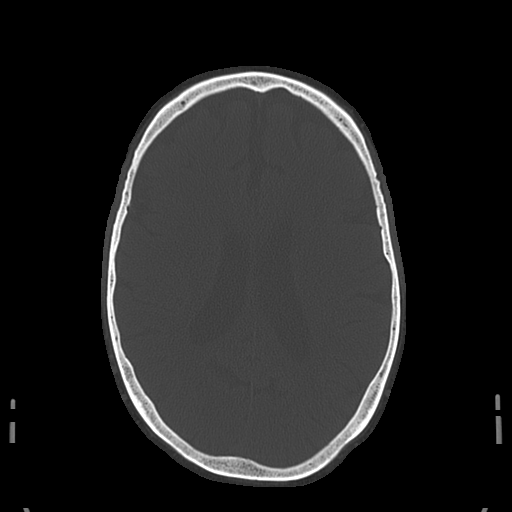
[im 38/54  bone]
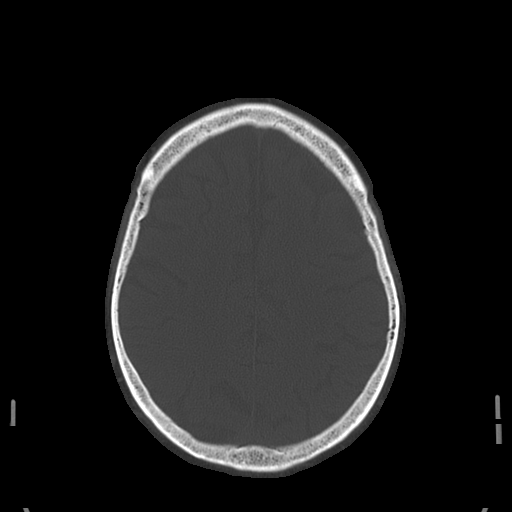
[im 44/54  bone]
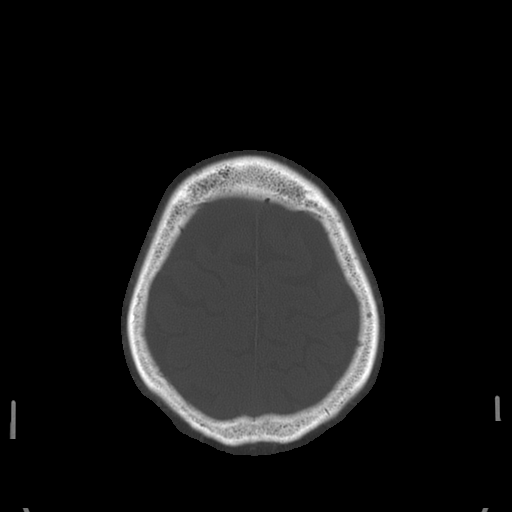

[17 of 30 positions shown; findings below may reference images not displayed]

FINDINGS: Bone windows demonstrate mild right supraorbital soft
tissue swelling.   Clear paranasal sinuses and mastoid air cells.
Mild osteopenia. No skull fracture.

Soft tissue windows demonstrate expected cerebral atrophy.  Mild
low density in the periventricular white matter likely related to
small vessel disease. No  mass lesion, hemorrhage, hydrocephalus,
acute infarct, intra-axial, or extra-axial fluid collection.
IMPRESSION: 1. No acute intracranial abnormality.
2. Cerebral atrophy and small vessel ischemic change.
3.  Mild right supraorbital soft tissue swelling.

## 2011-08-31 LAB — BASIC METABOLIC PANEL
BUN: 13 mg/dL (ref 6–23)
CO2: 24 mEq/L (ref 19–32)
Calcium: 8.9 mg/dL (ref 8.4–10.5)
Chloride: 108 mEq/L (ref 96–112)
Creatinine, Ser: 0.99 mg/dL (ref 0.4–1.5)

## 2011-08-31 LAB — CBC
HCT: 46.5 % (ref 39.0–52.0)
MCV: 100 fL (ref 78.0–100.0)
Platelets: 271 10*3/uL (ref 150–400)
RDW: 13.7 % (ref 11.5–15.5)

## 2012-01-07 DIAGNOSIS — K625 Hemorrhage of anus and rectum: Secondary | ICD-10-CM | POA: Diagnosis not present

## 2012-04-08 DIAGNOSIS — F329 Major depressive disorder, single episode, unspecified: Secondary | ICD-10-CM | POA: Diagnosis not present

## 2012-04-08 DIAGNOSIS — F0390 Unspecified dementia without behavioral disturbance: Secondary | ICD-10-CM | POA: Diagnosis not present

## 2012-05-15 DIAGNOSIS — I2510 Atherosclerotic heart disease of native coronary artery without angina pectoris: Secondary | ICD-10-CM | POA: Diagnosis not present

## 2012-05-15 DIAGNOSIS — I252 Old myocardial infarction: Secondary | ICD-10-CM | POA: Diagnosis not present

## 2012-05-15 DIAGNOSIS — Z0000 Encounter for general adult medical examination without abnormal findings: Secondary | ICD-10-CM | POA: Diagnosis not present

## 2012-08-06 DIAGNOSIS — I2510 Atherosclerotic heart disease of native coronary artery without angina pectoris: Secondary | ICD-10-CM | POA: Diagnosis not present

## 2012-11-08 ENCOUNTER — Encounter (HOSPITAL_COMMUNITY): Payer: Self-pay | Admitting: Family Medicine

## 2012-11-08 ENCOUNTER — Observation Stay (HOSPITAL_COMMUNITY): Payer: Medicare Other

## 2012-11-08 ENCOUNTER — Observation Stay (HOSPITAL_COMMUNITY)
Admission: EM | Admit: 2012-11-08 | Discharge: 2012-11-09 | Disposition: A | Discharge status: Home / Self Care | Payer: Medicare Other | Attending: Internal Medicine | Admitting: Internal Medicine

## 2012-11-08 DIAGNOSIS — F32A Depression, unspecified: Secondary | ICD-10-CM | POA: Diagnosis present

## 2012-11-08 DIAGNOSIS — C649 Malignant neoplasm of unspecified kidney, except renal pelvis: Secondary | ICD-10-CM | POA: Diagnosis present

## 2012-11-08 DIAGNOSIS — K922 Gastrointestinal hemorrhage, unspecified: Secondary | ICD-10-CM | POA: Diagnosis present

## 2012-11-08 DIAGNOSIS — K921 Melena: Secondary | ICD-10-CM | POA: Diagnosis not present

## 2012-11-08 DIAGNOSIS — I219 Acute myocardial infarction, unspecified: Secondary | ICD-10-CM | POA: Diagnosis not present

## 2012-11-08 DIAGNOSIS — K625 Hemorrhage of anus and rectum: Secondary | ICD-10-CM | POA: Diagnosis not present

## 2012-11-08 DIAGNOSIS — N19 Unspecified kidney failure: Secondary | ICD-10-CM | POA: Diagnosis present

## 2012-11-08 DIAGNOSIS — Z79899 Other long term (current) drug therapy: Secondary | ICD-10-CM | POA: Diagnosis not present

## 2012-11-08 DIAGNOSIS — K648 Other hemorrhoids: Principal | ICD-10-CM | POA: Insufficient documentation

## 2012-11-08 DIAGNOSIS — I2510 Atherosclerotic heart disease of native coronary artery without angina pectoris: Secondary | ICD-10-CM | POA: Diagnosis not present

## 2012-11-08 DIAGNOSIS — F329 Major depressive disorder, single episode, unspecified: Secondary | ICD-10-CM | POA: Diagnosis present

## 2012-11-08 DIAGNOSIS — F3289 Other specified depressive episodes: Secondary | ICD-10-CM | POA: Insufficient documentation

## 2012-11-08 DIAGNOSIS — K219 Gastro-esophageal reflux disease without esophagitis: Secondary | ICD-10-CM | POA: Diagnosis present

## 2012-11-08 DIAGNOSIS — I10 Essential (primary) hypertension: Secondary | ICD-10-CM | POA: Diagnosis not present

## 2012-11-08 DIAGNOSIS — F0390 Unspecified dementia without behavioral disturbance: Secondary | ICD-10-CM | POA: Diagnosis not present

## 2012-11-08 DIAGNOSIS — Z85528 Personal history of other malignant neoplasm of kidney: Secondary | ICD-10-CM | POA: Insufficient documentation

## 2012-11-08 HISTORY — DX: Major depressive disorder, single episode, unspecified: F32.9

## 2012-11-08 HISTORY — DX: Acute myocardial infarction, unspecified: I21.9

## 2012-11-08 HISTORY — DX: Malignant neoplasm of unspecified kidney, except renal pelvis: C64.9

## 2012-11-08 HISTORY — DX: Unspecified dementia, unspecified severity, without behavioral disturbance, psychotic disturbance, mood disturbance, and anxiety: F03.90

## 2012-11-08 HISTORY — DX: Gastro-esophageal reflux disease without esophagitis: K21.9

## 2012-11-08 HISTORY — DX: Depression, unspecified: F32.A

## 2012-11-08 LAB — COMPREHENSIVE METABOLIC PANEL
ALT: 14 U/L (ref 0–53)
AST: 23 U/L (ref 0–37)
Albumin: 3.5 g/dL (ref 3.5–5.2)
Calcium: 8.9 mg/dL (ref 8.4–10.5)
GFR calc Af Amer: 41 mL/min — ABNORMAL LOW (ref >90)
GFR calc non Af Amer: 35 mL/min — ABNORMAL LOW (ref >90)
Sodium: 138 mEq/L (ref 135–145)
Total Bilirubin: 0.6 mg/dL (ref 0.3–1.2)
Total Protein: 7.2 g/dL (ref 6.0–8.3)

## 2012-11-08 LAB — HEMOGLOBIN AND HEMATOCRIT, BLOOD
HCT: 39.8 % (ref 39.0–52.0)
Hemoglobin: 13 g/dL (ref 13.0–17.0)

## 2012-11-08 LAB — CBC WITH DIFFERENTIAL/PLATELET
Basophils Absolute: 0 10*3/uL (ref 0.0–0.1)
HCT: 42 % (ref 39.0–52.0)
Hemoglobin: 14 g/dL (ref 13.0–17.0)
Lymphocytes Relative: 26 % (ref 12–46)
Lymphs Abs: 2 10*3/uL (ref 0.7–4.0)
Monocytes Absolute: 0.6 10*3/uL (ref 0.1–1.0)
Monocytes Relative: 8 % (ref 3–12)
Neutro Abs: 4.7 10*3/uL (ref 1.7–7.7)
RBC: 4.64 MIL/uL (ref 4.22–5.81)
RDW: 15.2 % (ref 11.5–15.5)
WBC: 7.5 10*3/uL (ref 4.0–10.5)

## 2012-11-08 LAB — TYPE AND SCREEN: Antibody Screen: NEGATIVE

## 2012-11-08 LAB — OCCULT BLOOD, POC DEVICE: Fecal Occult Bld: POSITIVE — AB

## 2012-11-08 LAB — APTT: aPTT: 40 seconds — ABNORMAL HIGH (ref 24–37)

## 2012-11-08 MED ORDER — SODIUM CHLORIDE 0.9 % IV SOLN
INTRAVENOUS | Status: AC
  Administered 2012-11-08: 21:00:00 via INTRAVENOUS

## 2012-11-08 MED ORDER — GUAIFENESIN-DM 100-10 MG/5ML PO SYRP
5.0000 mL | ORAL_SOLUTION | ORAL | Status: DC | PRN
  Filled 2012-11-08: qty 5

## 2012-11-08 MED ORDER — ONDANSETRON HCL 4 MG PO TABS: 4.0000 mg | ORAL_TABLET | Freq: Four times a day (QID) | ORAL | Status: DC | PRN

## 2012-11-08 MED ORDER — NITROGLYCERIN 0.4 MG SL SUBL: 0.4000 mg | SUBLINGUAL_TABLET | SUBLINGUAL | Status: DC | PRN

## 2012-11-08 MED ORDER — SERTRALINE HCL 25 MG PO TABS
25.0000 mg | ORAL_TABLET | Freq: Every day | ORAL | Status: DC
  Filled 2012-11-08: qty 1

## 2012-11-08 MED ORDER — HYDROCODONE-ACETAMINOPHEN 5-325 MG PO TABS: 1.0000 | ORAL_TABLET | ORAL | Status: DC | PRN

## 2012-11-08 MED ORDER — AMOXICILLIN 500 MG PO CAPS
500.0000 mg | ORAL_CAPSULE | Freq: Three times a day (TID) | ORAL | Status: DC
  Administered 2012-11-08: 500 mg via ORAL
  Filled 2012-11-08 (×4): qty 1

## 2012-11-08 MED ORDER — ONDANSETRON HCL 4 MG/2ML IJ SOLN: 4.0000 mg | Freq: Four times a day (QID) | INTRAMUSCULAR | Status: DC | PRN

## 2012-11-08 MED ORDER — HYDROCORTISONE ACETATE 25 MG RE SUPP
25.0000 mg | Freq: Two times a day (BID) | RECTAL | Status: DC
  Administered 2012-11-08: 25 mg via RECTAL
  Filled 2012-11-08 (×3): qty 1

## 2012-11-08 MED ORDER — SODIUM CHLORIDE 0.9 % IJ SOLN
3.0000 mL | Freq: Two times a day (BID) | INTRAMUSCULAR | Status: DC
  Administered 2012-11-08: 3 mL via INTRAVENOUS

## 2012-11-08 MED ORDER — POLYETHYLENE GLYCOL 3350 17 G PO PACK
17.0000 g | PACK | Freq: Every day | ORAL | Status: DC | PRN
  Filled 2012-11-08: qty 1

## 2012-11-08 MED ORDER — METOPROLOL TARTRATE 12.5 MG HALF TABLET
12.5000 mg | ORAL_TABLET | Freq: Two times a day (BID) | ORAL | Status: DC
Start: 2012-11-08 — End: 2012-11-09
  Administered 2012-11-08: 12.5 mg via ORAL
  Filled 2012-11-08 (×3): qty 1

## 2012-11-08 MED ORDER — AMOXICILLIN 500 MG PO CAPS: 500.0000 mg | ORAL_CAPSULE | Freq: Three times a day (TID) | ORAL | Status: DC

## 2012-11-08 MED ORDER — DONEPEZIL HCL 10 MG PO TABS
10.0000 mg | ORAL_TABLET | Freq: Every day | ORAL | Status: DC
Start: 2012-11-08 — End: 2012-11-09
  Administered 2012-11-08: 10 mg via ORAL
  Filled 2012-11-08 (×2): qty 1

## 2012-11-08 MED ORDER — MEMANTINE HCL 10 MG PO TABS
10.0000 mg | ORAL_TABLET | Freq: Two times a day (BID) | ORAL | Status: DC
  Administered 2012-11-08: 10 mg via ORAL
  Filled 2012-11-08 (×3): qty 1

## 2012-11-08 MED ORDER — PANTOPRAZOLE SODIUM 40 MG IV SOLR
40.0000 mg | Freq: Once | INTRAVENOUS | Status: AC
  Administered 2012-11-08: 40 mg via INTRAVENOUS
  Filled 2012-11-08: qty 40

## 2012-11-08 MED ORDER — ALBUTEROL SULFATE (5 MG/ML) 0.5% IN NEBU: 2.5000 mg | INHALATION_SOLUTION | RESPIRATORY_TRACT | Status: DC | PRN

## 2012-11-08 MED ORDER — SODIUM CHLORIDE 0.9 % IV BOLUS (SEPSIS)
500.0000 mL | Freq: Once | INTRAVENOUS | Status: AC
  Administered 2012-11-08: 500 mL via INTRAVENOUS

## 2012-11-08 NOTE — ED Notes (Signed)
Per wife pt had 7 stools yesterday and 2 today with frank blood. sts it has gotten increasingly worse. Denies any other symptoms. Denies pain. and gave him 2 suppositorires but no better.

## 2012-11-08 NOTE — H&P (Signed)
[facility]                                                                                    Patient Demographics  [name], is a 78 y.o. male  CSN: [id number]  MRN: [id number]  DOB - [birth year]  Admit Date - 11/08/2012  Outpatient Primary MD for the patient is Darnelle Bos, MD   With History of -  Past Medical History  Diagnosis Date  . Depression shingles  . Heart attack   . Dementia   . Esophageal reflux   . Renal cancer       Past Surgical History  Procedure Date  . Coronary artery bypass graft   . Vasectomy   . Hemorrhoid surgery   . Cataracts   . Nephrectomy   . Appendectomy   . Coronary stent placement     in for   Chief Complaint  Patient presents with  . Rectal Bleeding     HPI  [name]  is a 78 y.o. male, with history of renal cancer status post nephrectomy, who has been declared cancer free last year, CAD with no active symptoms, history of hemorrhoidal bleed in the past requiring hemorrhoidectomy, early dementia, presents to the hospital with 36-48 hour history of bright red blood per rectum related to stools, states that as far as he remembers blood is usually at the end of bowel movement, he denies any abdominal cramping, any abdominal pain, no nausea vomiting, he's not on any blood thinners, no over-the-counter soma or Goody powder, takes an aspirin a day, he does say that he was recently started on a antibiotic for reasons he does not recall few days ago, in the ER his H&H is stable, he is hemodynamically stable, I was called to admit the patient for blood per rectum. Of note his creatinine is noted to be 1.8, no previous creatinine levels are available in the system.    Review of Systems    In addition to the HPI above,  No Fever-chills, No Headache, No changes with Vision or hearing, No problems swallowing food or Liquids, No Chest pain, Cough or Shortness of Breath, No Abdominal pain, No Nausea or  Vommitting, Bowel movements are regular, No Blood in Urine, some bright red blood related with bowel movements he says most likely after the end of stool ongoing for one to 2 days. No dysuria, No new skin rashes or bruises, No new joints pains-aches,  No new weakness, tingling, numbness in any extremity, No recent weight gain or loss, No polyuria, polydypsia or polyphagia, No significant Mental Stressors.  A full 10 point Review of Systems was done, except as stated above, all other Review of Systems were negative.   Social History History  Substance Use Topics  . Smoking status: Never Smoker   . Smokeless tobacco: Not on file  . Alcohol Use: No     Family History No history of colon or GI malignancies that he remembers  Prior to Admission medications   Medication Sig Start Date End Date Taking? Authorizing Provider  acetaminophen (TYLENOL) 500 MG tablet Take 500 mg by mouth every 6 (six) hours as needed.  For pain   Yes Historical Provider, MD  amoxicillin (AMOXIL) 500 MG capsule Take 500 mg by mouth 3 (three) times daily.   Yes Historical Provider, MD  aspirin EC 325 MG tablet Take 325 mg by mouth daily.   Yes Historical Provider, MD  donepezil (ARICEPT) 10 MG tablet Take 10 mg by mouth at bedtime.   Yes Historical Provider, MD  HYDROcodone-acetaminophen (NORCO/VICODIN) 5-325 MG per tablet Take 1-2 tablets by mouth every 6 (six) hours as needed. For pain   Yes Historical Provider, MD  hydrocortisone (ANUSOL-HC) 25 MG suppository Place 25 mg rectally 2 (two) times daily as needed. To help with rectal bleeding   Yes Historical Provider, MD  memantine (NAMENDA) 10 MG tablet Take 10 mg by mouth 2 (two) times daily.   Yes Historical Provider, MD  metoprolol tartrate (LOPRESSOR) 25 MG tablet Take 12.5 mg by mouth 2 (two) times daily.   Yes Historical Provider, MD  Multiple Vitamins-Minerals (MULTIVITAMIN PO) Take 1 tablet by mouth daily.   Yes Historical Provider, MD  nitroGLYCERIN  (NITROSTAT) 0.4 MG SL tablet Place 0.4 mg under the tongue every 5 (five) minutes as needed. For chest pain   Yes Historical Provider, MD  olmesartan (BENICAR) 20 MG tablet Take 10 mg by mouth daily.   Yes Historical Provider, MD  RABEprazole (ACIPHEX) 20 MG tablet Take 20 mg by mouth daily.   Yes Historical Provider, MD  sertraline (ZOLOFT) 25 MG tablet Take 25 mg by mouth daily.   Yes Historical Provider, MD    No Known Allergies  Physical Exam  Vitals  Blood pressure 121/55, pulse 61, temperature 98.2 F (36.8 C), temperature source Oral, resp. rate 17, height 5\' 11"  (1.803 m), weight 92.987 kg (205 lb), SpO2 98.00%.   1. General pleasant elderly Caucasian male who is mildly confused lying in bed in NAD,    2. Normal affect and insight, Not Suicidal or Homicidal, Awake Alert, Oriented X 3.  3. No F.N deficits, ALL C.Nerves Intact, Strength 5/5 all 4 extremities, Sensation intact all 4 extremities, Plantars down going.  4. Ears and Eyes appear Normal, Conjunctivae clear, PERRLA. Moist Oral Mucosa.  5. Supple Neck, No JVD, No cervical lymphadenopathy appriciated, No Carotid Bruits.  6. Symmetrical Chest wall movement, Good air movement bilaterally, CTAB.  7. RRR, No Gallops, Rubs or Murmurs, No Parasternal Heave.  8. Positive Bowel Sounds, Abdomen Soft, Non tender, No organomegaly appriciated,No rebound -guarding or rigidity. No external hemorrhoids, old crusted blood around rectum, no blood in the rectal wall. No evidence of melena or stool in the rectal vault.  9.  No Cyanosis, Normal Skin Turgor, No Skin Rash or Bruise.  10. Good muscle tone,  joints appear normal , no effusions, Normal ROM.  11. No Palpable Lymph Nodes in Neck or Axillae    Data Review  CBC  Lab 11/08/12 1656  WBC 7.5  HGB 14.0  HCT 42.0  PLT 198  MCV 90.5  MCH 30.2  MCHC 33.3  RDW 15.2  LYMPHSABS 2.0  MONOABS 0.6  EOSABS 0.1  BASOSABS 0.0  BANDABS --    ------------------------------------------------------------------------------------------------------------------  Chemistries   Lab 11/08/12 1656  NA 138  Sutton 4.3  CL 105  CO2 19  GLUCOSE 96  BUN 24*  CREATININE 1.71*  CALCIUM 8.9  MG --  AST 23  ALT 14  ALKPHOS 86  BILITOT 0.6   ------------------------------------------------------------------------------------------------------------------ estimated creatinine clearance is 37.5 ml/min (by C-G formula based on Cr of 1.71). ------------------------------------------------------------------------------------------------------------------  No results found for this basename: TSH,T4TOTAL,FREET3,T3FREE,THYROIDAB in the last 72 hours   Coagulation profile  Lab 11/08/12 1656  INR 1.11  PROTIME --   ------------------------------------------------------------------------------------------------------------------- No results found for this basename: DDIMER:2 in the last 72 hours -------------------------------------------------------------------------------------------------------------------  Cardiac Enzymes No results found for this basename: CK:3,CKMB:3,TROPONINI:3,MYOGLOBIN:3 in the last 168 hours ------------------------------------------------------------------------------------------------------------------ No components found with this basename: POCBNP:3   ---------------------------------------------------------------------------------------------------------------  Urinalysis No results found for this basename: colorurine, appearanceur, labspec, phurine, glucoseu, hgbur, bilirubinur, ketonesur, proteinur, urobilinogen, nitrite, leukocytesur    ----------------------------------------------------------------------------------------------------------------  Imaging results:   Dg Abd 1 View  11/08/2012   *RADIOLOGY REPORT*  Clinical Data: Rectal bleeding.  ABDOMEN - 1 VIEW  Comparison:  None.  Findings: The  bowel gas pattern is normal.  No radio-opaque calculi or other significant radiographic abnormality is seen. Surgical clips right mid abdomen.  Mild degenerative change lumbar spine. Prior CABG.  IMPRESSION: Negative.   Original Report Authenticated By: Davonna Belling, M.D.        Assessment & Plan   1. Blood per rectum which most likely is lower GI bleed due to new internal hemorrhoids, patient with history of same in the past with hemorrhoidectomy- he's had bleeding related to stool for about day and a half without significant drop in H&H, BUN is unremarkable, no GI symptoms, plan is to keep him on 23 hour observation, monitor H&H, type and screen, Protonix drip for now. If any evidence of significant bleed is suggested GI should be consulted.    2. History of CAD. No acute issues, no chest discomfort whatsoever, hold his aspirin, continue beta blocker along with when necessary nitroglycerin.    3. History of early dementia. No acute issues, he is at risk for delirium while in the hospital.    4. History of renal cancer with nephrectomy, creatinine in our system today is around 1.8, no previous creatinine available, have left message for Dr. Earl Gala who will be assuming care of the patient in the morning and also happens to be patient's primary care physician to obtain last BMP from his office. For now will hold his ARB, gentle IV fluids repeat BMP in the morning.    5. History of hypertension. No acute issues for now ARB will be held, monitor blood pressure. When necessary hydralazine if needed.    DVT Prophylaxis  SCDs    AM Labs Ordered, also please review Full Orders  Family Communication: Admission, patients condition and plan of care including tests being ordered have been discussed with the patient and wife and daughter who indicate understanding and agree with the plan and Code Status.  Code Status full  Disposition Plan: home  Time spent in minutes : 35  Condition  [name] M.D on 11/08/2012 at 7:19 PM  Between 7am to 7pm - Pager - [phone]  After 7pm go to [email or web address] - password [email or web address]  And look for the night coverage person covering me after hours  [facility] Office  [phone]

## 2012-11-08 NOTE — ED Provider Notes (Signed)
History     CSN: [id number]  Arrival date & time 11/08/12  1627   First MD Initiated Contact with Patient 11/08/12 1647      Chief Complaint  Patient presents with  . Rectal Bleeding    (Consider location/radiation/quality/duration/timing/severity/associated sxs/prior treatment) HPI Pt brought to the ED by family who report he had a root canal earlier this week, started on Amoxil then but no diarrhea until he began to have multiple liquid and bloody stools yesterday. He had about 8 episodes yesterday and three more today. Stool has been all bright red blood. He denies any abdominal pain, vomiting, fever, CP, SOB, or lightheadedness. He is taking ASA, but no other blood thinners. As had prior colonoscopies with polypectomy but no known diverticulosis.   Past Medical History  Diagnosis Date  . Depression shingles  . Heart attack   . Dementia   . Esophageal reflux     Past Surgical History  Procedure Date  . Coronary artery bypass graft   . Vasectomy   . Hemorrhoid surgery   . Cataracts   . Nephrectomy   . Appendectomy   . Coronary stent placement     History reviewed. No pertinent family history.  History  Substance Use Topics  . Smoking status: Never Smoker   . Smokeless tobacco: Not on file  . Alcohol Use: No      Review of Systems All other systems reviewed and are negative except as noted in HPI.   Allergies  Review of patient's allergies indicates no known allergies.  Home Medications   Current Outpatient Rx  Name  Route  Sig  Dispense  Refill  . ACETAMINOPHEN 500 MG PO TABS   Oral   Take 500 mg by mouth every 6 (six) hours as needed. For pain         . AMOXICILLIN 500 MG PO CAPS   Oral   Take 500 mg by mouth 3 (three) times daily.         . ASPIRIN EC 325 MG PO TBEC   Oral   Take 325 mg by mouth daily.         . DONEPEZIL HCL 10 MG PO TABS   Oral   Take 10 mg by mouth at bedtime.         Marland Kitchen HYDROCODONE-ACETAMINOPHEN 5-325 MG PO  TABS   Oral   Take 1-2 tablets by mouth every 6 (six) hours as needed. For pain         . HYDROCORTISONE ACETATE 25 MG RE SUPP   Rectal   Place 25 mg rectally 2 (two) times daily as needed. To help with rectal bleeding         . MEMANTINE HCL 10 MG PO TABS   Oral   Take 10 mg by mouth 2 (two) times daily.         Marland Kitchen METOPROLOL TARTRATE 25 MG PO TABS   Oral   Take 12.5 mg by mouth 2 (two) times daily.         . MULTIVITAMIN PO   Oral   Take 1 tablet by mouth daily.         Marland Kitchen NITROGLYCERIN 0.4 MG SL SUBL   Sublingual   Place 0.4 mg under the tongue every 5 (five) minutes as needed. For chest pain         . OLMESARTAN MEDOXOMIL 20 MG PO TABS   Oral   Take 10 mg by mouth daily.         Marland Kitchen  RABEPRAZOLE SODIUM 20 MG PO TBEC   Oral   Take 20 mg by mouth daily.         . SERTRALINE HCL 25 MG PO TABS   Oral   Take 25 mg by mouth daily.           BP 121/46  Pulse 84  Temp 97.6 F (36.4 C) (Oral)  Resp 20  SpO2 94%  Physical Exam  Nursing note and vitals reviewed. Constitutional: He is oriented to person, place, and time. He appears well-developed and well-nourished.  HENT:  Head: Normocephalic and atraumatic.  Eyes: EOM are normal. Pupils are equal, round, and reactive to light.  Neck: Normal range of motion. Neck supple.  Cardiovascular: Normal rate, normal heart sounds and intact distal pulses.   Pulmonary/Chest: Effort normal and breath sounds normal.  Abdominal: Bowel sounds are normal. He exhibits no distension. There is no tenderness.  Genitourinary:       Dried blood in perineal area; no hemorrhoids or fissues; no stool in rectum but heme positive  Musculoskeletal: Normal range of motion. He exhibits no edema and no tenderness.  Neurological: He is alert and oriented to person, place, and time. He has normal strength. No cranial nerve deficit or sensory deficit.  Skin: Skin is warm and dry. No rash noted.  Psychiatric: He has a normal mood and  affect.    ED Course  Procedures (including critical care time)  Labs Reviewed  COMPREHENSIVE METABOLIC PANEL - Abnormal; Notable for the following:    BUN 24 (*)     Creatinine, Ser 1.71 (*)     GFR calc non Af Amer 35 (*)     GFR calc Af Amer 41 (*)     All other components within normal limits  APTT - Abnormal; Notable for the following:    aPTT 40 (*)     All other components within normal limits  OCCULT BLOOD, POC DEVICE - Abnormal; Notable for the following:    Fecal Occult Bld POSITIVE (*)     All other components within normal limits  CBC WITH DIFFERENTIAL  PROTIME-INR  TYPE AND SCREEN  ABO/RH   No results found.   No diagnosis found.    MDM  Vitals and Hgb stable in the ED. Discussed admission for observation with Hospitalist, Dr. Burney Gauze and awaiting call from [facility], Dr. Madilyn Fireman to inform him the patient will need consult tomorrow as well. Protonix and KUB ordered at request of Dr. Thedore Mins.        Eldrick Penick B. Bernette Mayers, MD 11/08/12 [phone]

## 2012-11-09 DIAGNOSIS — K921 Melena: Secondary | ICD-10-CM | POA: Diagnosis not present

## 2012-11-09 DIAGNOSIS — C649 Malignant neoplasm of unspecified kidney, except renal pelvis: Secondary | ICD-10-CM | POA: Diagnosis not present

## 2012-11-09 DIAGNOSIS — F329 Major depressive disorder, single episode, unspecified: Secondary | ICD-10-CM | POA: Diagnosis not present

## 2012-11-09 LAB — CBC
Platelets: 181 10*3/uL (ref 150–400)
RDW: 15.2 % (ref 11.5–15.5)
WBC: 6.7 10*3/uL (ref 4.0–10.5)

## 2012-11-09 LAB — BASIC METABOLIC PANEL
Calcium: 8.6 mg/dL (ref 8.4–10.5)
Creatinine, Ser: 1.52 mg/dL — ABNORMAL HIGH (ref 0.50–1.35)
GFR calc Af Amer: 47 mL/min — ABNORMAL LOW (ref >90)

## 2012-11-09 LAB — PROTIME-INR
INR: 1.21 (ref 0.00–1.49)
Prothrombin Time: 15.1 seconds (ref 11.6–15.2)

## 2012-11-09 NOTE — Consult Note (Signed)
[facility] Consult Note  Referring Provider: No ref. provider found Primary Care Physician:  Darnelle Bos, MD Primary Gastroenterologist:  Dr.  Chief Complaint: Rectal bleeding HPI: [name] is an 78 y.o. white male  admitted with painless hematochezia of 2 or 3 days duration last night. His hemoglobin on admission was 14 and this morning is 12.3. He had no bowel movements throughout the night and I believe was being discharged this morning by the primary care physician but since then had a fairly substantial episode of bright red blood or ileus moderate volume witnessed by the nurse but later flushed. He is having no abdominal pain. He states he has had multiple colonoscopies in the past but none recently. Is not aware of any diverticular bleeding and states that his bleeding in the past has been attributed to hemorrhoids.  Past Medical History  Diagnosis Date  . Depression shingles  . Heart attack   . Dementia   . Esophageal reflux   . Renal cancer     Past Surgical History  Procedure Date  . Coronary artery bypass graft   . Vasectomy   . Hemorrhoid surgery   . Cataracts   . Nephrectomy   . Appendectomy   . Coronary stent placement     Medications Prior to Admission  Medication Sig Dispense Refill  . acetaminophen (TYLENOL) 500 MG tablet Take 500 mg by mouth every 6 (six) hours as needed. For pain      . amoxicillin (AMOXIL) 500 MG capsule Take 500 mg by mouth 3 (three) times daily.      Marland Kitchen aspirin EC 325 MG tablet Take 325 mg by mouth daily.      Marland Kitchen donepezil (ARICEPT) 10 MG tablet Take 10 mg by mouth at bedtime.      Marland Kitchen HYDROcodone-acetaminophen (NORCO/VICODIN) 5-325 MG per tablet Take 1-2 tablets by mouth every 6 (six) hours as needed. For pain      . hydrocortisone (ANUSOL-HC) 25 MG suppository Place 25 mg rectally 2 (two) times daily as needed. To help with rectal bleeding      . memantine (NAMENDA) 10 MG tablet Take 10 mg by mouth 2 (two)  times daily.      . metoprolol tartrate (LOPRESSOR) 25 MG tablet Take 12.5 mg by mouth 2 (two) times daily.      . Multiple Vitamins-Minerals (MULTIVITAMIN PO) Take 1 tablet by mouth daily.      . nitroGLYCERIN (NITROSTAT) 0.4 MG SL tablet Place 0.4 mg under the tongue every 5 (five) minutes as needed. For chest pain      . olmesartan (BENICAR) 20 MG tablet Take 10 mg by mouth daily.      . RABEprazole (ACIPHEX) 20 MG tablet Take 20 mg by mouth daily.      . sertraline (ZOLOFT) 25 MG tablet Take 25 mg by mouth daily.        Allergies: No Known Allergies  History reviewed. No pertinent family history.  Social History:  reports that he has never smoked. He does not have any smokeless tobacco history on file. He reports that he does not drink alcohol or use illicit drugs.  Review of Systems: negative except as above   Blood pressure 107/55, pulse 67, temperature 98 F (36.7 C), temperature source Oral, resp. rate 18, height 5\' 11"  (1.803 m), weight 90.5 kg (199 lb 8.3 oz), SpO2 98.00%. Head: Normocephalic, without obvious abnormality, atraumatic Neck: no adenopathy, no carotid bruit, no JVD, supple, symmetrical, trachea midline and  thyroid not enlarged, symmetric, no tenderness/mass/nodules Resp: clear to auscultation bilaterally Cardio: regular rate and rhythm, S1, S2 normal, no murmur, click, rub or gallop GI: Abdomen soft nondistended with normoactive bowel sounds no hepatomegaly mass guarding the Extremities: extremities normal, atraumatic, no cyanosis or edema  Results for orders placed during the hospital encounter of 11/08/12 (from the past 48 hour(s))  CBC WITH DIFFERENTIAL     Status: Normal   Collection Time   11/08/12  4:56 PM      Component Value Range Comment   WBC 7.5  4.0 - 10.5 K/uL    RBC 4.64  4.22 - 5.81 MIL/uL    Hemoglobin 14.0  13.0 - 17.0 g/dL    HCT 16.1  09.6 - 04.5 %    MCV 90.5  78.0 - 100.0 fL    MCH 30.2  26.0 - 34.0 pg    MCHC 33.3  30.0 - 36.0 g/dL     RDW 40.9  81.1 - 91.4 %    Platelets 198  150 - 400 K/uL    Neutrophils Relative 63  43 - 77 %    Neutro Abs 4.7  1.7 - 7.7 K/uL    Lymphocytes Relative 26  12 - 46 %    Lymphs Abs 2.0  0.7 - 4.0 K/uL    Monocytes Relative 8  3 - 12 %    Monocytes Absolute 0.6  0.1 - 1.0 K/uL    Eosinophils Relative 2  0 - 5 %    Eosinophils Absolute 0.1  0.0 - 0.7 K/uL    Basophils Relative 0  0 - 1 %    Basophils Absolute 0.0  0.0 - 0.1 K/uL   COMPREHENSIVE METABOLIC PANEL     Status: Abnormal   Collection Time   11/08/12  4:56 PM      Component Value Range Comment   Sodium 138  135 - 145 mEq/L    Potassium 4.3  3.5 - 5.1 mEq/L    Chloride 105  96 - 112 mEq/L    CO2 19  19 - 32 mEq/L    Glucose, Bld 96  70 - 99 mg/dL    BUN 24 (*) 6 - 23 mg/dL    Creatinine, Ser 7.82 (*) 0.50 - 1.35 mg/dL    Calcium 8.9  8.4 - 95.6 mg/dL    Total Protein 7.2  6.0 - 8.3 g/dL    Albumin 3.5  3.5 - 5.2 g/dL    AST 23  0 - 37 U/L    ALT 14  0 - 53 U/L    Alkaline Phosphatase 86  39 - 117 U/L    Total Bilirubin 0.6  0.3 - 1.2 mg/dL    GFR calc non Af Amer 35 (*) >90 mL/min    GFR calc Af Amer 41 (*) >90 mL/min   PROTIME-INR     Status: Normal   Collection Time   11/08/12  4:56 PM      Component Value Range Comment   Prothrombin Time 14.2  11.6 - 15.2 seconds    INR 1.11  0.00 - 1.49   APTT     Status: Abnormal   Collection Time   11/08/12  4:56 PM      Component Value Range Comment   aPTT 40 (*) 24 - 37 seconds   TYPE AND SCREEN     Status: Normal   Collection Time   11/08/12  5:05 PM      Component Value  Range Comment   ABO/RH(D) O POS      Antibody Screen NEG      Sample Expiration 11/11/2012     ABO/RH     Status: Normal   Collection Time   11/08/12  5:05 PM      Component Value Range Comment   ABO/RH(D) O POS     OCCULT BLOOD, POC DEVICE     Status: Abnormal   Collection Time   11/08/12  5:18 PM      Component Value Range Comment   Fecal Occult Bld POSITIVE (*) NEGATIVE   HEMOGLOBIN AND  HEMATOCRIT, BLOOD     Status: Normal   Collection Time   11/08/12  9:00 PM      Component Value Range Comment   Hemoglobin 13.0  13.0 - 17.0 g/dL    HCT 84.6  96.2 - 95.2 %   BASIC METABOLIC PANEL     Status: Abnormal   Collection Time   11/09/12  2:16 AM      Component Value Range Comment   Sodium 138  135 - 145 mEq/L    Potassium 4.4  3.5 - 5.1 mEq/L    Chloride 111  96 - 112 mEq/L    CO2 16 (*) 19 - 32 mEq/L    Glucose, Bld 102 (*) 70 - 99 mg/dL    BUN 24 (*) 6 - 23 mg/dL    Creatinine, Ser 8.41 (*) 0.50 - 1.35 mg/dL    Calcium 8.6  8.4 - 32.4 mg/dL    GFR calc non Af Amer 40 (*) >90 mL/min    GFR calc Af Amer 47 (*) >90 mL/min   CBC     Status: Abnormal   Collection Time   11/09/12  2:16 AM      Component Value Range Comment   WBC 6.7  4.0 - 10.5 K/uL    RBC 4.23  4.22 - 5.81 MIL/uL    Hemoglobin 12.3 (*) 13.0 - 17.0 g/dL    HCT 40.1 (*) 02.7 - 52.0 %    MCV 88.9  78.0 - 100.0 fL    MCH 29.1  26.0 - 34.0 pg    MCHC 32.7  30.0 - 36.0 g/dL    RDW 25.3  66.4 - 40.3 %    Platelets 181  150 - 400 K/uL   PROTIME-INR     Status: Normal   Collection Time   11/09/12  2:16 AM      Component Value Range Comment   Prothrombin Time 15.1  11.6 - 15.2 seconds    INR 1.21  0.00 - 1.49    Dg Abd 1 View  11/08/2012   *RADIOLOGY REPORT*  Clinical Data: Rectal bleeding.  ABDOMEN - 1 VIEW  Comparison:  None.  Findings: The bowel gas pattern is normal.  No radio-opaque calculi or other significant radiographic abnormality is seen. Surgical clips right mid abdomen.  Mild degenerative change lumbar spine. Prior CABG.  IMPRESSION: Negative.   Original Report Authenticated By: Davonna Belling, M.D.     Assessment: Rectal bleeding unclear whether perianal or lower GI bleeding Plan:  Advised the nurse to call Dr. Emmaline Life reassess whether he still wants and followed up as an outpatient or to be kept for further monitoring. Tine Mabee C 11/09/2012, 9:51 AM

## 2012-11-09 NOTE — Progress Notes (Signed)
[name] to be D/C'd Home per MD order.  Discussed with the patient and patient's daughter, and all questions fully answered.   [name], [name]  Home Medication Instructions ZOX:[id number]   Printed on:11/09/12 0900  Medication Information                    olmesartan (BENICAR) 20 MG tablet Take 10 mg by mouth daily.           memantine (NAMENDA) 10 MG tablet Take 10 mg by mouth 2 (two) times daily.           RABEprazole (ACIPHEX) 20 MG tablet Take 20 mg by mouth daily.           donepezil (ARICEPT) 10 MG tablet Take 10 mg by mouth at bedtime.           HYDROcodone-acetaminophen (NORCO/VICODIN) 5-325 MG per tablet Take 1-2 tablets by mouth every 6 (six) hours as needed. For pain           metoprolol tartrate (LOPRESSOR) 25 MG tablet Take 12.5 mg by mouth 2 (two) times daily.           sertraline (ZOLOFT) 25 MG tablet Take 25 mg by mouth daily.           nitroGLYCERIN (NITROSTAT) 0.4 MG SL tablet Place 0.4 mg under the tongue every 5 (five) minutes as needed. For chest pain           aspirin EC 325 MG tablet Take 325 mg by mouth daily.           Multiple Vitamins-Minerals (MULTIVITAMIN PO) Take 1 tablet by mouth daily.           acetaminophen (TYLENOL) 500 MG tablet Take 500 mg by mouth every 6 (six) hours as needed. For pain           amoxicillin (AMOXIL) 500 MG capsule Take 500 mg by mouth 3 (three) times daily.           hydrocortisone (ANUSOL-HC) 25 MG suppository Place 25 mg rectally 2 (two) times daily as needed. To help with rectal bleeding             VVS, Skin clean, dry and intact without evidence of skin break down, no evidence of skin tears noted. IV catheter infiltrated prior to discharge order. IV slightly swollen with redness. Dressing and pressure, and heat pack applied.  An After Visit Summary was printed and given to the patient. Patient escorted via WC, and D/C home via private auto.  [place], [employer] 11/09/2012 9:00 AM

## 2012-11-09 NOTE — Discharge Summary (Signed)
Physician Discharge Summary   Patient ID: [name] [id number] y.o. February 24, 1926  Admit date: 11/08/2012  Discharge date and time: No discharge date for patient encounter.   Admitting Physician: Leroy Sea, MD   Discharge Physician: Merlene Laughter  Admission Diagnoses: Esophageal reflux [530.81] GI bleeding [578.9] Heart attack [410.90] Depression [311] Renal cancer [189.0] Dementia [294.20] rectal bleeding   Discharge Diagnoses: hemorrhoidal bleeding Dementia Cad Depression Renal cell cancer  Admission Condition: good  Discharged Condition: good  Indication for Admission: lower Gi bleeding  Hospital Course: The patient had experienced periodic bright red rectal bleeding. During the night he had no further bleeding and repeat rectal exam negative for blood.  Bleeding felt to be consistent with recurrent internal hemorrhoidal bleeding flex sig could be considered as out patient  Consults: None  Significant Diagnostic Studies: labs: Hgb at time of discharge 12.3, mild drop likely from rehydration, admission hgb 14 Treatments: : Iv fluid  and liquid diet  Discharge Exam: rectal exam is normal no blood  Disposition:   Patient Instructions:    Medication List     As of 11/09/2012  9:03 AM    TAKE these medications         acetaminophen 500 MG tablet   Commonly known as: TYLENOL   Take 500 mg by mouth every 6 (six) hours as needed. For pain      amoxicillin 500 MG capsule   Commonly known as: AMOXIL   Take 500 mg by mouth 3 (three) times daily.      aspirin EC 325 MG tablet   Take 325 mg by mouth daily.      donepezil 10 MG tablet   Commonly known as: ARICEPT   Take 10 mg by mouth at bedtime.      HYDROcodone-acetaminophen 5-325 MG per tablet   Commonly known as: NORCO/VICODIN   Take 1-2 tablets by mouth every 6 (six) hours as needed. For pain      hydrocortisone 25 MG suppository   Commonly known as: ANUSOL-HC   Place 25 mg rectally  2 (two) times daily as needed. To help with rectal bleeding      memantine 10 MG tablet   Commonly known as: NAMENDA   Take 10 mg by mouth 2 (two) times daily.      metoprolol tartrate 25 MG tablet   Commonly known as: LOPRESSOR   Take 12.5 mg by mouth 2 (two) times daily.      MULTIVITAMIN PO   Take 1 tablet by mouth daily.      nitroGLYCERIN 0.4 MG SL tablet   Commonly known as: NITROSTAT   Place 0.4 mg under the tongue every 5 (five) minutes as needed. For chest pain      olmesartan 20 MG tablet   Commonly known as: BENICAR   Take 10 mg by mouth daily.      RABEprazole 20 MG tablet   Commonly known as: ACIPHEX   Take 20 mg by mouth daily.      sertraline 25 MG tablet   Commonly known as: ZOLOFT   Take 25 mg by mouth daily.       Activity: activity as tolerated Diet: activity as tolerated Wound Care: none needed  Follow-up with *Dr Earl Gala in 2  weeks  Signed: Ginette Otto 11/09/2012 9:03 AM

## 2012-11-12 DIAGNOSIS — K625 Hemorrhage of anus and rectum: Secondary | ICD-10-CM | POA: Diagnosis not present

## 2012-11-12 DIAGNOSIS — R7989 Other specified abnormal findings of blood chemistry: Secondary | ICD-10-CM | POA: Diagnosis not present

## 2012-11-17 DIAGNOSIS — R7989 Other specified abnormal findings of blood chemistry: Secondary | ICD-10-CM | POA: Diagnosis not present

## 2012-11-25 DIAGNOSIS — Z23 Encounter for immunization: Secondary | ICD-10-CM | POA: Diagnosis not present

## 2012-12-16 DIAGNOSIS — F0390 Unspecified dementia without behavioral disturbance: Secondary | ICD-10-CM | POA: Diagnosis not present

## 2012-12-16 DIAGNOSIS — F329 Major depressive disorder, single episode, unspecified: Secondary | ICD-10-CM | POA: Diagnosis not present

## 2013-02-24 ENCOUNTER — Other Ambulatory Visit: Payer: Self-pay | Admitting: Dermatology

## 2013-02-24 DIAGNOSIS — C44319 Basal cell carcinoma of skin of other parts of face: Secondary | ICD-10-CM | POA: Diagnosis not present

## 2013-05-19 DIAGNOSIS — Z0000 Encounter for general adult medical examination without abnormal findings: Secondary | ICD-10-CM | POA: Diagnosis not present

## 2013-05-19 DIAGNOSIS — Z1331 Encounter for screening for depression: Secondary | ICD-10-CM | POA: Diagnosis not present

## 2013-05-19 DIAGNOSIS — I2510 Atherosclerotic heart disease of native coronary artery without angina pectoris: Secondary | ICD-10-CM | POA: Diagnosis not present

## 2013-05-19 DIAGNOSIS — Z79899 Other long term (current) drug therapy: Secondary | ICD-10-CM | POA: Diagnosis not present

## 2013-05-19 DIAGNOSIS — R413 Other amnesia: Secondary | ICD-10-CM | POA: Diagnosis not present

## 2013-05-19 DIAGNOSIS — I252 Old myocardial infarction: Secondary | ICD-10-CM | POA: Diagnosis not present

## 2013-08-27 DIAGNOSIS — Z23 Encounter for immunization: Secondary | ICD-10-CM | POA: Diagnosis not present

## 2013-12-16 DIAGNOSIS — G609 Hereditary and idiopathic neuropathy, unspecified: Secondary | ICD-10-CM | POA: Diagnosis not present

## 2014-02-23 DIAGNOSIS — G4710 Hypersomnia, unspecified: Secondary | ICD-10-CM | POA: Diagnosis not present

## 2014-02-23 DIAGNOSIS — F3289 Other specified depressive episodes: Secondary | ICD-10-CM | POA: Diagnosis not present

## 2014-02-23 DIAGNOSIS — F329 Major depressive disorder, single episode, unspecified: Secondary | ICD-10-CM | POA: Diagnosis not present

## 2014-02-23 DIAGNOSIS — F0390 Unspecified dementia without behavioral disturbance: Secondary | ICD-10-CM | POA: Diagnosis not present

## 2014-03-17 DIAGNOSIS — G589 Mononeuropathy, unspecified: Secondary | ICD-10-CM | POA: Diagnosis not present

## 2014-03-21 IMAGING — CR DG ABDOMEN 1V
2 series · 2 of 2 positions shown · non-contrast
Comparison: None.

CLINICAL DATA: Rectal bleeding.

ABDOMEN - 1 VIEW

[t abdomen supine (1 of 2)]
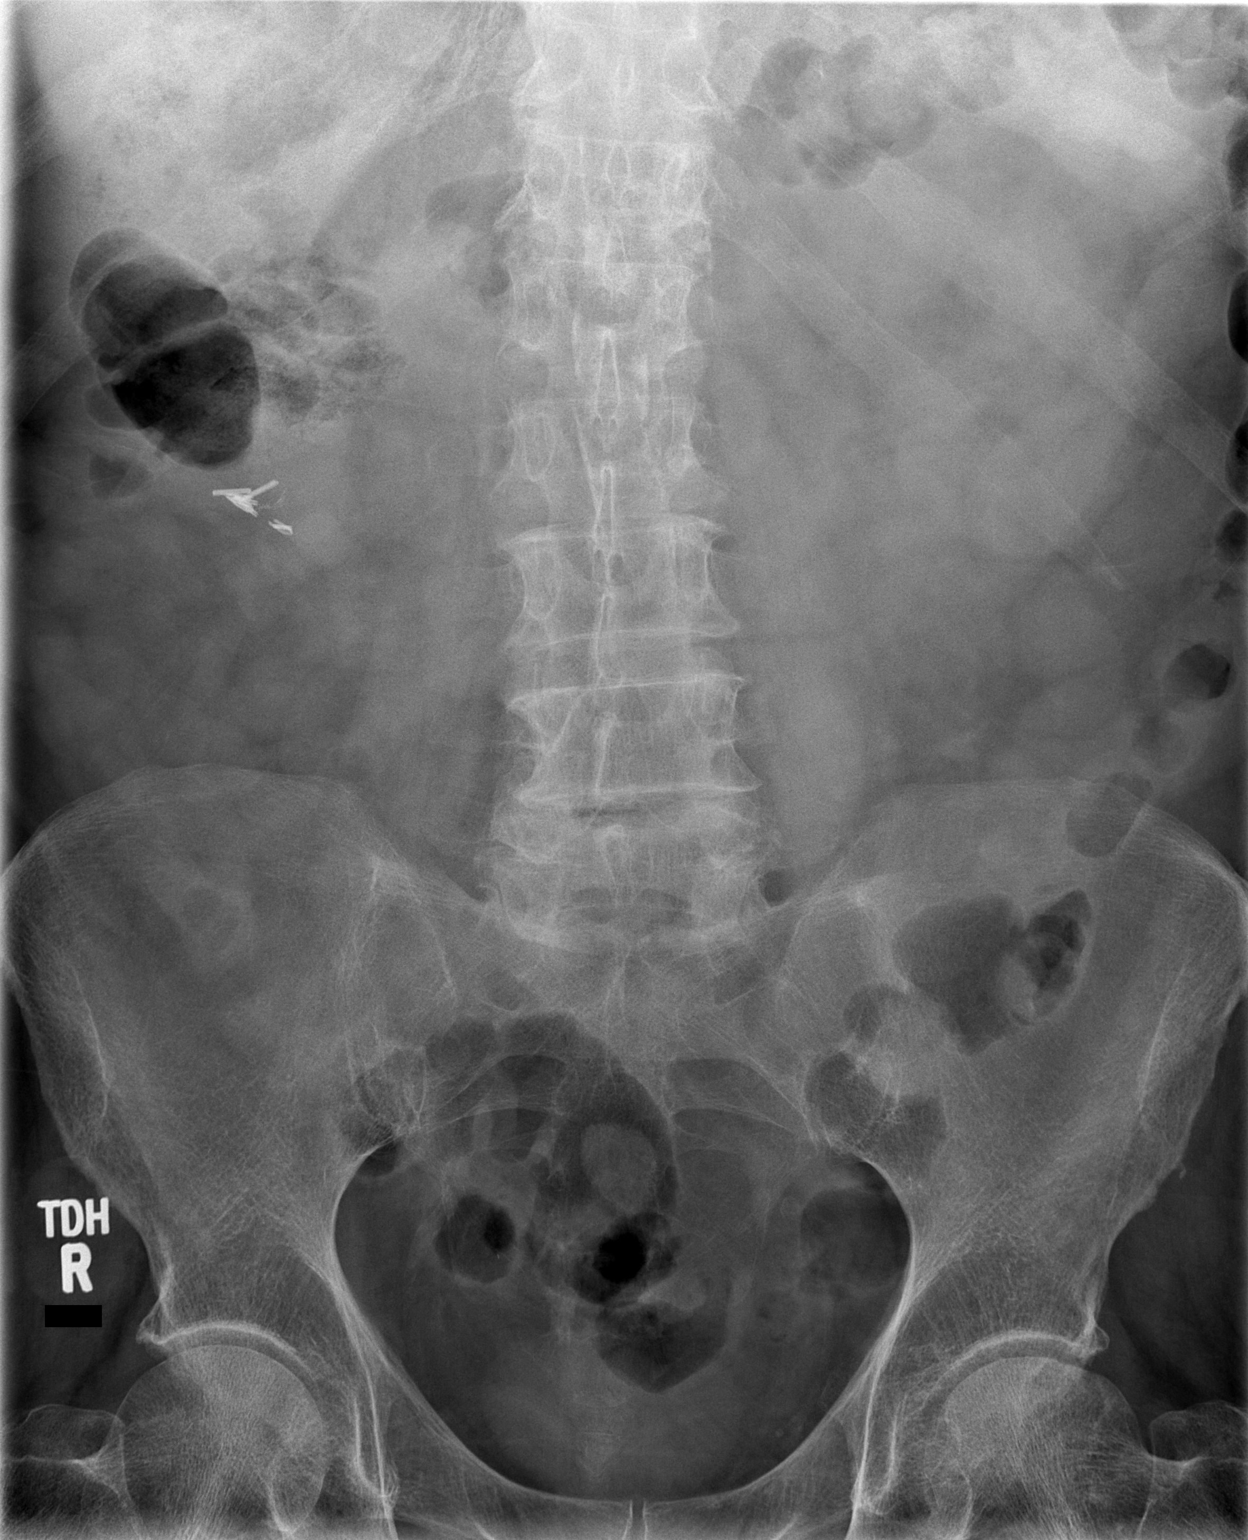

[t abdomen supine (2 of 2)]
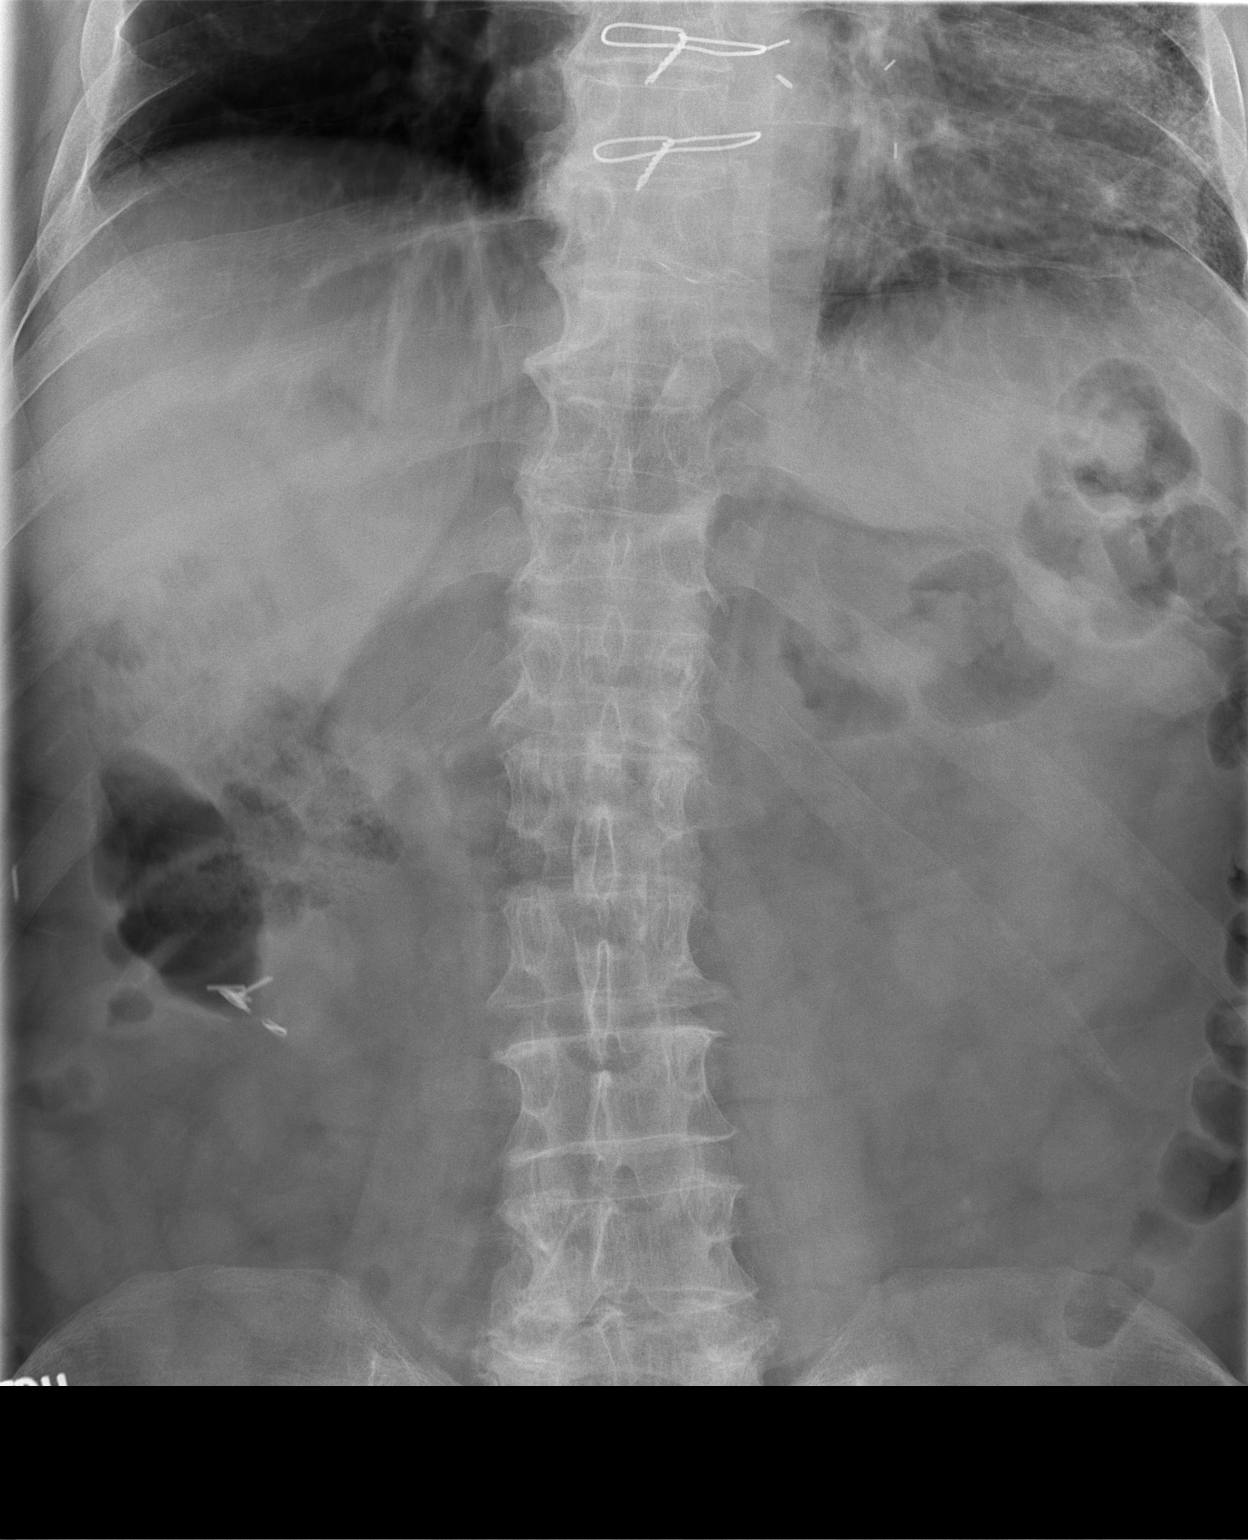

[2 of 2 positions shown; findings below may reference images not displayed]

FINDINGS: The bowel gas pattern is normal.  No radio-opaque calculi
or other significant radiographic abnormality is seen. Surgical
clips right mid abdomen.  Mild degenerative change lumbar spine.
Prior CABG.
IMPRESSION: Negative.

## 2014-03-25 ENCOUNTER — Ambulatory Visit (INDEPENDENT_AMBULATORY_CARE_PROVIDER_SITE_OTHER): Payer: Medicare Other | Admitting: Neurology

## 2014-03-25 DIAGNOSIS — G609 Hereditary and idiopathic neuropathy, unspecified: Secondary | ICD-10-CM | POA: Diagnosis not present

## 2014-03-25 DIAGNOSIS — IMO0002 Reserved for concepts with insufficient information to code with codable children: Secondary | ICD-10-CM

## 2014-03-25 DIAGNOSIS — M5417 Radiculopathy, lumbosacral region: Secondary | ICD-10-CM

## 2014-03-29 ENCOUNTER — Encounter: Payer: Self-pay | Admitting: Neurology

## 2014-03-29 DIAGNOSIS — M5417 Radiculopathy, lumbosacral region: Secondary | ICD-10-CM | POA: Insufficient documentation

## 2014-03-29 DIAGNOSIS — G609 Hereditary and idiopathic neuropathy, unspecified: Secondary | ICD-10-CM | POA: Insufficient documentation

## 2014-03-29 NOTE — Progress Notes (Signed)
See procedure note for EMG results.  Chad K. Patel, DO  

## 2014-03-29 NOTE — Procedures (Signed)
[facility]  [place], [place]  [place], [place] [address] Tel: [phone] Fax:  [phone] Test Date:  03/25/2014  Patient: [name], [name] DOB: 04-23-1926 Physician: Narda Amber, DO  Sex: Male Height: 5\' 9"  Ref Phys: Narda Amber  ID#: [id number] Temp: 32.0C Technician: Laureen Ochs R. NCS T.   Patient Complaints: This is an 80 year-old man with paresthesias of the feet and gait instability.  NCV & EMG Findings: Extensive evaluation of the left lower extremity and additional studies of the right reveals: 1. Absent sural and superficial peroneal sensory responses bilaterally. 2. The peroneal motor response recording at the extensor digitorum brevis is reduced, however when recording at the tibialis anterior the motor responses normal in amplitude. The tibial motor responses shows normal amplitudes and mildly reduced conduction velocity on the left (Knee-Ankle, 36 m/s).   3. Needle electrode examination shows chronic motor axonal loss changes affecting the tibialis anterior, flexor digitorum longus, and rectus femoris muscles on the left side. Similar findings are seen on the right side with the addition of chronic motor axonal loss changes affecting the gluteus medius muscle.  Impression: 1. There is electrophysiological evidence of a generalized large fiber sensorimotor polyneuropathy affecting bilateral lower extremities.  There is also a superimposed old L5 radiculopathy predominantly affecting the right side, mild in degree electrically.   ___________________________ Narda Amber, DO    Nerve Conduction Studies Anti Sensory Summary Table   Site NR Peak (ms) Norm Peak (ms) P-T Amp (V) Norm P-T Amp  Left Sup Peroneal Anti Sensory (Ant Lat Mall)  12 cm NR  <4.6  >3  Right Sup Peroneal Anti Sensory (Ant Lat Mall)  12 cm NR  <4.6  >3  Left Sural Anti Sensory (Lat Mall)  Calf NR  <4.6  >3  Right Sural Anti Sensory (Lat Mall)  Calf NR  <4.6  >3    Motor Summary Table   Site NR Onset (ms) Norm Onset (ms) O-P Amp (mV) Norm O-P Amp Site1 Site2 Delta-0 (ms) Dist (cm) Vel (m/s) Norm Vel (m/s)  Left Peroneal Motor (Ext Dig Brev)  Ankle    4.6 <6.0 0.7 >2.5 B Fib Ankle 7.3 30.0 41 >40  B Fib    11.9  0.7  Poplt B Fib 2.2 8.5 39 >40  Poplt    14.1  0.6         Right Peroneal Motor (Ext Dig Brev)  Ankle    3.5 <6.0 0.6 >2.5 B Fib Ankle 7.8 31.0 40 >40  B Fib    11.3  0.5  Poplt B Fib 2.5 11.0 44 >40  Poplt    13.8  0.5         Left Peroneal TA Motor (Tib Ant)  Fib Head    2.3 <4.5 3.2 >3 Poplit Fib Head 1.8 10.0 56 >40  Poplit    4.1  3.1         Right Peroneal TA Motor (Tib Ant)  Fib Head    3.0 <4.5 3.6 >3 Poplit Fib Head 1.5 11.0 73 >40  Poplit    4.5  3.6         Left Tibial Motor (Abd Hall Brev)  Ankle    3.6 <6.0 6.1 >4 Knee Ankle 10.7 39.0 36 >40  Knee    14.3  3.4         Right Tibial Motor (Abd Hall Brev)  Ankle    3.8 <6.0 8.9 >4 Knee Ankle 9.2  40.5 44 >40  Knee    13.0  5.7          EMG   Side Muscle Ins Act Fibs Psw Fasc Number Recrt Dur Dur. Amp Amp. Poly Poly. Comment  Right AntTibialis Nml Nml Nml Nml 1- Mod-R Some 1+ Few 1+ Nml Nml N/A  Right Gastroc Nml Nml Nml Nml Nml Nml Nml Nml Nml Nml Nml Nml N/A  Right Flex Dig Long Nml Nml Nml Nml 1- Mod-R Few 1+ Few 1+ Nml Nml N/A  Right RectFemoris Nml Nml Nml Nml Nml Nml Nml Nml Nml Nml Nml Nml N/A  Right AdductorLong Nml Nml Nml Nml Nml Nml Nml Nml Nml Nml Nml Nml N/A  Right GluteusMed Nml Nml Nml Nml 1- Mod-R Some 1+ Nml Nml Nml Nml N/A  Left AntTibialis Nml Nml Nml Nml 1- Mod-R Some 1+ Few 1+ Nml Nml N/A  Left Gastroc Nml Nml Nml Nml 1- Mod-V Nml Nml Nml Nml Nml Nml N/A  Left Flex Dig Long Nml Nml Nml Nml 1- Mod-R Some 1+ Nml Nml Nml Nml N/A  Left RectFemoris Nml Nml Nml Nml 1- Mod Few 1+ Nml Nml Nml Nml N/A  Left GluteusMed Nml Nml Nml Nml Nml Nml Nml Nml Nml Nml Nml Nml N/A  Left BicepsFemS Nml Nml Nml Nml 1- Mod Nml Nml Nml Nml Nml Nml N/A      Waveforms:

## 2014-04-05 ENCOUNTER — Ambulatory Visit (INDEPENDENT_AMBULATORY_CARE_PROVIDER_SITE_OTHER): Payer: Medicare Other | Admitting: Neurology

## 2014-04-05 ENCOUNTER — Encounter: Payer: Self-pay | Admitting: Neurology

## 2014-04-05 VITALS — BP 100/64 | HR 63 | Ht 69.69 in | Wt 191.1 lb

## 2014-04-05 DIAGNOSIS — M25559 Pain in unspecified hip: Secondary | ICD-10-CM

## 2014-04-05 DIAGNOSIS — G609 Hereditary and idiopathic neuropathy, unspecified: Secondary | ICD-10-CM | POA: Diagnosis not present

## 2014-04-05 DIAGNOSIS — M25552 Pain in left hip: Secondary | ICD-10-CM

## 2014-04-05 LAB — FERRITIN: FERRITIN: 12 ng/mL — AB (ref 22–322)

## 2014-04-05 MED ORDER — GABAPENTIN 100 MG PO CAPS: ORAL_CAPSULE | ORAL | Status: DC

## 2014-04-05 NOTE — Progress Notes (Signed)
Note faxed.

## 2014-04-05 NOTE — Patient Instructions (Addendum)
1.  Start taking gabapentin as follows:  Take one tablet (100mg ) at bedtime x 7 days, then increase to 2 tablets at bedtime thereafter (200mg ).  Please avoid using Norco in the evening as we are introducing a new medication. 2.  Check blood work 3.  Start vitamin B12 1036mcg daily 4.  Recommend to follow-up with Dr. Maxwell Caul regarding hip pain 5.  Return to clinic 6 weeks

## 2014-04-05 NOTE — Progress Notes (Signed)
[facility] Clinic Note - Initial Visit   Date: 04/05/2014    [name] MRN: [id number] DOB: 01-13-1926   Dear Dr Maxwell Caul:  Thank you for your kind referral of [name] for consultation of peripheral neuropathy. Although his history is well known to you, please allow [place] to reiterate it for the purpose of our medical record. The patient was accompanied to the clinic by daughter and wife who also provides collateral information.     History of Present Illness: [name] is a 80 y.o. right-handed Caucasian male with history of CAD s/p CABG (1997) and PCI/stent (2005), renal cancer s/p nephrectomy (2007), depression, GERD, and mild-moderate Alzheimer's dementia (followed at [facility]) presenting for evaluation of numbness/tingling his feet.    Starting in November 2014, his daughter says that she recalls that he would need to get up from the dinner to walk.  He feels that his legs are going to sleep and so tried to walk it off, which helps to a moderate degree.  He has pain when his feet wake up.  Numbness involves his mid-calf to his feet.  Over the past several months, symptoms have become more constant.    His wife notices that he complains of it more in the evening.  He denies any exacerbating factors. No associated back pain.  He complains of hip pain.  He was walking independently until January 2015 when he started using a rollator.  No falls.  He has tried NSAIDs and norco for pain, which has not helped.  His EMG showed generalized large fiber sensorimotor polyneuropathy affecting the legs and a superimposed right L5 radiculopathy.    Regarding his Alzheimer's dementia, he is being followed at [facility]. He takes namenda 28mg  and Aricept 10mg  daily.  He is not driving or taking care of his finances.  He is able to bathe and dress himself, but required a lot of prompting.    Out-side paper records, electronic medical  record, and images have been reviewed where available and summarized as:  Labs 12/16/2013:  Vitamin B12 358, TSH 2.27, SPEP - no Chad protein Labs 05/19/2013:  Na 140, K 4.7, Chl 109, Cr 1.24*, GFR 55*, Ca 8.8, AST 15, ALT 13  EMG 03/25/2014: 1. There is electrophysiological evidence of a generalized large fiber sensorimotor polyneuropathy affecting bilateral lower extremities.  2. There is also a superimposed old L5 radiculopathy predominantly affecting the right side, mild in degree electrically.    Past Medical History  Diagnosis Date  . Depression shingles  . Heart attack   . Dementia   . Esophageal reflux   . Renal cancer 2007    s/p nephrectomy    Past Surgical History  Procedure Laterality Date  . Coronary artery bypass graft    . Vasectomy    . Hemorrhoid surgery    . Cataracts    . Nephrectomy    . Appendectomy    . Coronary stent placement    . Nephrectomy       Medications:  Current Outpatient Prescriptions on File Prior to Visit  Medication Sig Dispense Refill  . acetaminophen (TYLENOL) 500 MG tablet Take 500 mg by mouth every 6 (six) hours as needed. For pain      . aspirin EC 325 MG tablet Take 325 mg by mouth daily.      Marland Kitchen donepezil (ARICEPT) 10 MG tablet Take 10 mg by mouth at bedtime.      Marland Kitchen  HYDROcodone-acetaminophen (NORCO/VICODIN) 5-325 MG per tablet Take 1-2 tablets by mouth every 6 (six) hours as needed. For pain      . hydrocortisone (ANUSOL-HC) 25 MG suppository Place 25 mg rectally 2 (two) times daily as needed. To help with rectal bleeding      . memantine (NAMENDA) 10 MG tablet Take 10 mg by mouth 2 (two) times daily.      . metoprolol tartrate (LOPRESSOR) 25 MG tablet Take 12.5 mg by mouth 2 (two) times daily.      . Multiple Vitamins-Minerals (MULTIVITAMIN PO) Take 1 tablet by mouth daily.      . nitroGLYCERIN (NITROSTAT) 0.4 MG SL tablet Place 0.4 mg under the tongue every 5 (five) minutes as needed. For chest pain      . olmesartan (BENICAR) 20 MG  tablet Take 10 mg by mouth daily.      . RABEprazole (ACIPHEX) 20 MG tablet Take 20 mg by mouth daily.      . sertraline (ZOLOFT) 25 MG tablet Take 25 mg by mouth daily.       No current facility-administered medications on file prior to visit.    Allergies: No Known Allergies  Family History: Family History  Problem Relation Age of Onset  . CAD Father     Died, 80  . COPD Mother     Died, 15  . Skin cancer Daughter   . Depression Daughter     Social History: History   Social History  . Marital Status: Married    Spouse Name: N/A    Number of Children: N/A  . Years of Education: N/A   Occupational History  . retired    Social History Main Topics  . Smoking status: Former Smoker -- 1.00 packs/day for 47 years    Types: Cigarettes    Quit date: 11/26/1988  . Smokeless tobacco: Not on file  . Alcohol Use: Yes     Comment: He drinks one scotch + beer nightly  . Drug Use: No  . Sexual Activity: Not on file   Other Topics Concern  . Not on file   Social History Narrative   He lives with wife in independent living at [facility] since 2012.   He is retired English as a second language teacher.   Highest level of education:  B.S.          Review of Systems:  CONSTITUTIONAL: No fevers, chills, night sweats, or weight loss.   EYES: No visual changes or eye pain ENT: No hearing changes.  No history of nose bleeds.   RESPIRATORY: No cough, wheezing and shortness of breath.   CARDIOVASCULAR: Negative for chest pain, and palpitations.   GI: Negative for abdominal discomfort, blood in stools or black stools.  No recent change in bowel habits.   GU:  No history of incontinence.   MUSCLOSKELETAL: + history of joint pain or swelling.  No myalgias.   SKIN: Negative for lesions, rash, and itching.   HEMATOLOGY/ONCOLOGY: Negative for prolonged bleeding, bruising easily, and swollen nodes.   ENDOCRINE: Negative for cold or heat intolerance, polydipsia or goiter.   PSYCH:  No depression or anxiety  symptoms.   NEURO: As Above.   Vital Signs:  BP 100/64  Pulse 63  Ht 5' 9.69" (1.77 Chad)  Wt 191 lb 1 oz (86.665 kg)  BMI 27.66 kg/m2  SpO2 93%  Neurological Exam: MENTAL STATUS including orientation to time, place, person, recent and remote memory, attention span and concentration, language, and fund of knowledge is  fairly intact.  Speech is not dysarthric.  CRANIAL NERVES: II:  No visual field defects.  Unremarkable fundi.   III-IV-VI: Pupils equal round and reactive to light.  Normal conjugate, extra-ocular eye movements in all directions of gaze.  No nystagmus.  No ptosis.   V:  Normal facial sensation.    VII:  Normal facial symmetry and movements.  No pathologic facial reflexes.  VIII:  Normal hearing and vestibular function.   IX-X:  Normal palatal movement.   XI:  Normal shoulder shrug and head rotation.   XII:  Normal tongue strength and range of motion, no deviation or fasciculation.  MOTOR:  No atrophy, fasciculations or abnormal movements.  No pronator drift.  Tone is normal.    Right Upper Extremity:    Left Upper Extremity:    Deltoid  5/5   Deltoid  5/5   Biceps  5/5   Biceps  5/5   Triceps  5/5   Triceps  5/5   Wrist extensors  5/5   Wrist extensors  5/5   Wrist flexors  5/5   Wrist flexors  5/5   Finger extensors  5/5   Finger extensors  5/5   Finger flexors  5/5   Finger flexors  5/5   Dorsal interossei  5/5   Dorsal interossei  5/5   Abductor pollicis  5/5   Abductor pollicis  5/5   Tone (Ashworth scale)  0  Tone (Ashworth scale)  0   Right Lower Extremity:    Left Lower Extremity:    Hip flexors  5/5   Hip flexors  5/5   Hip extensors  5/5   Hip extensors  5/5   Knee flexors  5/5   Knee flexors  5/5   Knee extensors  5/5   Knee extensors  5/5   Dorsiflexors  5/5   Dorsiflexors  5/5   Plantarflexors  5/5   Plantarflexors  5/5   Toe extensors  5-/5   Toe extensors  5-/5   Toe flexors  5/5   Toe flexors  5/5   Tone (Ashworth scale)  0  Tone (Ashworth  scale)  0   MSRs:  Right                                                                 Left brachioradialis 2+  brachioradialis 2+  biceps 2+  biceps 2+  triceps 2+  triceps 2+  patellar 2+  patellar 2+  ankle jerk 0  ankle jerk 0  Hoffman no  Hoffman no  plantar response up  plantar response up   SENSORY:  Vibration is diminished at the ankles and absent at the great toe bilaterally. Otherwise, normal and symmetric perception of light touch, pinprick, and proprioception.    COORDINATION/GAIT: Normal finger-to- nose-finger.  Intact rapid alternating movements bilaterally.  Antalgic gait favoring the right leg due to left hip pain.  There is no dragging of the feet.   IMPRESSION: Mr. Ma Sutton 81 year old gentleman presenting for evaluation of paresthesias of his feet. He underwent EMG recently which was consistent with the generalized sensorimotor polyneuropathy affecting the feet. By history, some of his symptoms are overlapping with possible restless leg syndrome since his paresthesias improved with movement and worse at rest.  I think it is reasonable to start him on low dose of neurontin and see if this helps his paresthesias, as this can be helpful in both neuropathy and restless leg syndrome.  I had a lengthy discussion regarding the neurophysiology, potential etiology, management options, and natural course of peripheral neuropathy.  He has already been checked for elevated blood glucose, thyroid problems, vitamin B 12 deficiency, and paraproteinemias. His B12 levels as on the low side of normal, and given his dementia, I will have him start oral B12 supplements.  Additionally, I feel that his chronic left hip pain is contributing to his gait abnormalities to his neuropathy is, so have recommended that he follow up with Dr. Maxwell Caul.    PLAN/RECOMMENDATIONS:  1.  Check copper, ferritin, MMA 2.  Start vitamin B12 1052mcg daily 3.  Start neurontin 100mg  at bedtime x 7 days, then  increase to 2 tablets qhs.  Risks and benefits discussed.  Instructed him to avoid using Norco in the evening as we are introducing a new medication. 4.  Recommend to follow-up with Dr. Maxwell Caul regarding hip pain 5.  Return to clinic 6 weeks   The duration of this appointment visit was 45 minutes of face-to-face time with the patient.  Greater than 50% of this time was spent in counseling, explanation of diagnosis, planning of further management, and coordination of care.   Thank you for allowing me to participate in patient's care.  If I can answer any additional questions, I would be pleased to do so.    Sincerely,    Quanisha Drewry K. Posey Pronto, DO

## 2014-04-07 LAB — METHYLMALONIC ACID, SERUM: METHYLMALONIC ACID, QUANT: 110 nmol/L (ref 87–318)

## 2014-04-08 LAB — COPPER, SERUM: Copper: 101 ug/dL (ref 70–175)

## 2014-04-26 DIAGNOSIS — M25559 Pain in unspecified hip: Secondary | ICD-10-CM | POA: Diagnosis not present

## 2014-05-03 DIAGNOSIS — M25559 Pain in unspecified hip: Secondary | ICD-10-CM | POA: Diagnosis not present

## 2014-05-13 DIAGNOSIS — M87059 Idiopathic aseptic necrosis of unspecified femur: Secondary | ICD-10-CM | POA: Diagnosis not present

## 2014-05-13 DIAGNOSIS — M25559 Pain in unspecified hip: Secondary | ICD-10-CM | POA: Diagnosis not present

## 2014-05-19 ENCOUNTER — Ambulatory Visit: Payer: 59 | Admitting: Neurology

## 2014-05-20 DIAGNOSIS — M1610 Unilateral primary osteoarthritis, unspecified hip: Secondary | ICD-10-CM | POA: Diagnosis not present

## 2014-05-20 DIAGNOSIS — Z23 Encounter for immunization: Secondary | ICD-10-CM | POA: Diagnosis not present

## 2014-05-20 DIAGNOSIS — Z0000 Encounter for general adult medical examination without abnormal findings: Secondary | ICD-10-CM | POA: Diagnosis not present

## 2014-05-20 DIAGNOSIS — I10 Essential (primary) hypertension: Secondary | ICD-10-CM | POA: Diagnosis not present

## 2014-05-20 DIAGNOSIS — F0390 Unspecified dementia without behavioral disturbance: Secondary | ICD-10-CM | POA: Diagnosis not present

## 2014-05-20 DIAGNOSIS — M169 Osteoarthritis of hip, unspecified: Secondary | ICD-10-CM | POA: Diagnosis not present

## 2014-05-20 DIAGNOSIS — Z1331 Encounter for screening for depression: Secondary | ICD-10-CM | POA: Diagnosis not present

## 2014-05-20 DIAGNOSIS — G609 Hereditary and idiopathic neuropathy, unspecified: Secondary | ICD-10-CM | POA: Diagnosis not present

## 2014-05-20 DIAGNOSIS — I252 Old myocardial infarction: Secondary | ICD-10-CM | POA: Diagnosis not present

## 2014-05-26 ENCOUNTER — Ambulatory Visit: Payer: 59 | Admitting: Neurology

## 2014-06-29 ENCOUNTER — Encounter: Payer: Self-pay | Admitting: Neurology

## 2014-06-29 ENCOUNTER — Ambulatory Visit (INDEPENDENT_AMBULATORY_CARE_PROVIDER_SITE_OTHER): Payer: Medicare Other | Admitting: Neurology

## 2014-06-29 VITALS — BP 100/64 | HR 64 | Ht 69.69 in | Wt 191.4 lb

## 2014-06-29 DIAGNOSIS — M5417 Radiculopathy, lumbosacral region: Secondary | ICD-10-CM

## 2014-06-29 DIAGNOSIS — G609 Hereditary and idiopathic neuropathy, unspecified: Secondary | ICD-10-CM

## 2014-06-29 DIAGNOSIS — IMO0002 Reserved for concepts with insufficient information to code with codable children: Secondary | ICD-10-CM | POA: Diagnosis not present

## 2014-06-29 MED ORDER — LIDOCAINE 5 % EX OINT: 1.0000 "application " | TOPICAL_OINTMENT | Freq: Three times a day (TID) | CUTANEOUS | Status: AC | PRN

## 2014-06-29 MED ORDER — GABAPENTIN 300 MG PO CAPS: 300.0000 mg | ORAL_CAPSULE | Freq: Two times a day (BID) | ORAL | Status: AC

## 2014-06-29 NOTE — Progress Notes (Signed)
Follow-up Visit   Date: 06/29/2014    [name] MRN: [id number] DOB: 11-16-1926   Interim History: [name] is a 80 y.o. right-handed Caucasian male with history of CAD s/p CABG (1997) and PCI/stent (2005), renal cancer s/p nephrectomy (2007), depression, GERD, and mild-moderate Alzheimer's dementia (followed at [facility]) returning to the clinic for follow-up of peripheral neuropathy.  The patient was accompanied to the clinic by daughter and wife who also provides collateral information.  He was last seen in the clinic on 04/05/2013.  History of present illness: Starting in November 2014, his daughter says that she recalls that he would need to get up from the dinner to walk. He feels that his legs are going to sleep and so tried to walk it off, which helps to a moderate degree. He has pain when his feet wake up. Numbness involves his mid-calf to his feet. Over the past several months, symptoms have become more constant. His wife notices that he complains of it more in the evening. He denies any exacerbating factors. No associated back pain. He complains of hip pain. He was walking independently until January 2015 when he started using a rollator. No falls. He has tried NSAIDs and norco for pain, which has not helped. His EMG showed generalized large fiber sensorimotor polyneuropathy affecting the legs and a superimposed right L5 radiculopathy.  Regarding his Alzheimer's dementia, he is being followed at [facility]. He takes namenda 28mg  and Aricept 10mg  daily. He is not driving or taking care of his finances. He is able to bathe and dress himself, but required a lot of prompting.   UPDATE 06/29/2014:  At his last visit, neurontin 200mg  qhs and there was minimal and transient improvement.  The tingling is bothersome and has limited his activities because prolonged sitting makes it worse. He was found to have avascular necrosis of the left hip.  Cymbalta 30mg  and  alpha lipoic acid 600mg  daily, but has not noticed any change.   Medications:  Current Outpatient Prescriptions on File Prior to Visit  Medication Sig Dispense Refill  . acetaminophen (TYLENOL) 500 MG tablet Take 500 mg by mouth every 6 (six) hours as needed. For pain      . aspirin EC 325 MG tablet Take 325 mg by mouth daily.      Marland Kitchen donepezil (ARICEPT) 10 MG tablet Take 10 mg by mouth at bedtime.      . gabapentin (NEURONTIN) 100 MG capsule Take one tablet at bedtime x 7 days, then increase to 2 tablets thereafter.  60 capsule  5  . HYDROcodone-acetaminophen (NORCO/VICODIN) 5-325 MG per tablet Take 1-2 tablets by mouth every 6 (six) hours as needed. For pain      . hydrocortisone (ANUSOL-HC) 25 MG suppository Place 25 mg rectally 2 (two) times daily as needed. To help with rectal bleeding      . memantine (NAMENDA) 10 MG tablet Take 10 mg by mouth 2 (two) times daily.      . metoprolol tartrate (LOPRESSOR) 25 MG tablet Take 12.5 mg by mouth 2 (two) times daily.      . Multiple Vitamins-Minerals (MULTIVITAMIN PO) Take 1 tablet by mouth daily.      . nitroGLYCERIN (NITROSTAT) 0.4 MG SL tablet Place 0.4 mg under the tongue every 5 (five) minutes as needed. For chest pain      . olmesartan (BENICAR) 20 MG tablet Take 10 mg by mouth daily.      Marland Kitchen  RABEprazole (ACIPHEX) 20 MG tablet Take 20 mg by mouth daily.      . sertraline (ZOLOFT) 25 MG tablet Take 25 mg by mouth daily.       No current facility-administered medications on file prior to visit.    Allergies:  Allergies  Allergen Reactions  . Haldol [Haloperidol Lactate]      Review of Systems:  CONSTITUTIONAL: No fevers, chills, night sweats, or weight loss.   EYES: No visual changes or eye pain ENT: No hearing changes.  No history of nose bleeds.   RESPIRATORY: No cough, wheezing and shortness of breath.   CARDIOVASCULAR: Negative for chest pain, and palpitations.   GI: Negative for abdominal discomfort, blood in stools or black  stools.  No recent change in bowel habits.   GU:  No history of incontinence.   MUSCLOSKELETAL: +history of joint pain or swelling.  No myalgias.   SKIN: Negative for lesions, rash, and itching.   ENDOCRINE: Negative for cold or heat intolerance, polydipsia or goiter.   PSYCH:  No depression or anxiety symptoms.   NEURO: As Above.   Vital Signs:  BP 100/64  Pulse 64  Ht 5' 9.69" (1.77 m)  Wt 191 lb 7 oz (86.835 kg)  BMI 27.72 kg/m2  SpO2 93%  Neurological Exam: MENTAL STATUS including orientation to time, place, person, recent and remote memory, attention span and concentration, language, and fund of knowledge is fair.   Speech is not dysarthric.  CRANIAL NERVES:  Pupils equal round and reactive to light.  Normal conjugate, extra-ocular eye movements in all directions of gaze.  No ptosis. Normal facial sensation.  Face is symmetric. Palate elevates symmetrically.  Tongue is midline.  MOTOR:  Motor strength is 5/5 in all extremities, except 5-/5 toe extensors bilaterally and left hip flexion 5-/5 (pain-limiting).  No pronator drift.  Tone is normal.    MSRs:  Reflexes are 2+/4 throughout, except absent Achilles bilaterally.  Plantar response is extensor bilaterally.  SENSORY: Vibration is diminished at the ankles and absent at the great toe bilaterally  GAIT:  Antalgic gait due to left hip pain.    Data: Lab 04/05/2014:  Copper 101, MMA 110, ferritin 12* Labs 12/16/2013: Vitamin B12 358, TSH 2.27, SPEP - no M protein  Labs 05/19/2013: Na 140, K 4.7, Chl 109, Cr 1.24*, GFR 55*, Ca 8.8, AST 15, ALT 13   EMG 03/25/2014:  1. There is electrophysiological evidence of a generalized large fiber sensorimotor polyneuropathy affecting bilateral lower extremities.  2. There is also a superimposed old L5 radiculopathy predominantly affecting the right side, mild in degree electrically.   IMPRESSION: 1.  Idiopathic peripheral neuropathy manifesting with bilateral feet painful  paresthesias Clinically unchanged, no significant benefit with neurontin 200mg  qhs, cymbalta 30mg , or alpha lipoic acid Will increase neurontin to 300mg  qhs and slowly titrate to 300mg  BID over 4 weeks, will be cautious due to renal dysfunction.  Common side effects discussed.   If no improvement, ok to increase Cymbalta to 60mg  daily  2.  Possible superimposed restless leg syndrome Continue ferrous sulfate 325mg  daily, ferritin found to be low at 12 Going forward, may consider trial of sinemet to see if there is any improvement  3.  Alzheimer's dementia Continue Aricept 10mg  daily Continue namenda 10mg  BID  4.  Gait abnormalities due to left hip avascular necrosis, managed conservatively  5.  Chronic kidney disease   PLAN/RECOMMENDATIONS:  1.  Start taking neurontin as follows:    AM  PM  Week 1: 0  300mg   Week 2: 100mg   300mg   Week 3: 200mg   300mg   Week 4: 300mg   300mg  2.  Start using lidocaine ointment to feet 3.  Call with update in 34-months 4.  Return to clinic in 81-months   The duration of this appointment visit was 25 minutes of face-to-face time with the patient.  Greater than 50% of this time was spent in counseling, explanation of diagnosis, planning of further management, and coordination of care.   Thank you for allowing me to participate in patient's care.  If I can answer any additional questions, I would be pleased to do so.    Sincerely,    Donika K. Posey Pronto, DO

## 2014-06-29 NOTE — Progress Notes (Signed)
Note faxed.

## 2014-06-29 NOTE — Patient Instructions (Addendum)
1. Start taking neurontin as follows:    AM  PM  Week 1: 0  300mg   Week 2: 100mg   300mg   Week 3: 200mg   300mg   Week 4: 300mg   300mg    If you develop side effects (too sleepy, lightheadedness), go back to a lower dose  2.  Start using lidocaine ointment to feet  3.  Call with update in 91-months  4.  Return to clinic in 11-months

## 2014-07-10 ENCOUNTER — Encounter: Payer: Self-pay | Admitting: *Deleted

## 2014-09-11 DIAGNOSIS — Z23 Encounter for immunization: Secondary | ICD-10-CM | POA: Diagnosis not present

## 2014-11-30 DIAGNOSIS — I10 Essential (primary) hypertension: Secondary | ICD-10-CM | POA: Diagnosis not present

## 2014-11-30 DIAGNOSIS — G309 Alzheimer's disease, unspecified: Secondary | ICD-10-CM | POA: Diagnosis not present

## 2014-11-30 DIAGNOSIS — M87052 Idiopathic aseptic necrosis of left femur: Secondary | ICD-10-CM | POA: Diagnosis not present

## 2014-11-30 DIAGNOSIS — F329 Major depressive disorder, single episode, unspecified: Secondary | ICD-10-CM | POA: Diagnosis not present

## 2014-11-30 DIAGNOSIS — G609 Hereditary and idiopathic neuropathy, unspecified: Secondary | ICD-10-CM | POA: Diagnosis not present

## 2014-12-21 DIAGNOSIS — M87052 Idiopathic aseptic necrosis of left femur: Secondary | ICD-10-CM | POA: Diagnosis not present

## 2014-12-21 DIAGNOSIS — G309 Alzheimer's disease, unspecified: Secondary | ICD-10-CM | POA: Diagnosis not present

## 2014-12-21 DIAGNOSIS — G609 Hereditary and idiopathic neuropathy, unspecified: Secondary | ICD-10-CM | POA: Diagnosis not present

## 2014-12-21 DIAGNOSIS — I10 Essential (primary) hypertension: Secondary | ICD-10-CM | POA: Diagnosis not present

## 2014-12-21 DIAGNOSIS — F329 Major depressive disorder, single episode, unspecified: Secondary | ICD-10-CM | POA: Diagnosis not present

## 2014-12-30 ENCOUNTER — Ambulatory Visit (INDEPENDENT_AMBULATORY_CARE_PROVIDER_SITE_OTHER): Payer: Medicare Other | Admitting: Neurology

## 2014-12-30 ENCOUNTER — Encounter: Payer: Self-pay | Admitting: Neurology

## 2014-12-30 VITALS — BP 100/60 | HR 70 | Ht 69.0 in | Wt 187.1 lb

## 2014-12-30 DIAGNOSIS — M5417 Radiculopathy, lumbosacral region: Secondary | ICD-10-CM | POA: Diagnosis not present

## 2014-12-30 DIAGNOSIS — G609 Hereditary and idiopathic neuropathy, unspecified: Secondary | ICD-10-CM | POA: Diagnosis not present

## 2014-12-30 DIAGNOSIS — F0280 Dementia in other diseases classified elsewhere without behavioral disturbance: Secondary | ICD-10-CM

## 2014-12-30 DIAGNOSIS — G309 Alzheimer's disease, unspecified: Secondary | ICD-10-CM | POA: Diagnosis not present

## 2014-12-30 NOTE — Progress Notes (Signed)
Follow-up Visit   Date: 12/30/2014    [name] MRN: [id number] DOB: [birth year]   Interim History: [name] is a 81 y.o. right-handed Caucasian male with history of CAD s/p CABG (1997) and PCI/stent (2005), renal cancer s/p nephrectomy (2007), depression, GERD, and mild-moderate Alzheimer's dementia (followed at [facility]) returning to the clinic for follow-up of peripheral neuropathy.  The patient was accompanied to the clinic by daughter and wife who also provides collateral information.    History of present illness: Starting in November 2014, his daughter says that she recalls that he would need to get up from the dinner to walk. He feels that his legs are going to sleep and so tried to walk it off, which helps to a moderate degree. He has pain when his feet wake up. Numbness involves his mid-calf to his feet. Over the past several months, symptoms have become more constant. His wife notices that he complains of it more in the evening. He denies any exacerbating factors. No associated back pain. He complains of hip pain. He was walking independently until January 2015 when he started using a rollator. No falls. He has tried NSAIDs and norco for pain, which has not helped. His EMG showed generalized large fiber sensorimotor polyneuropathy affecting the legs and a superimposed right L5 radiculopathy.  Regarding his Alzheimer's dementia, he is being followed at [facility]. He takes namenda 28mg  and Aricept 10mg  daily. He is not driving or taking care of his finances. He is able to bathe and dress himself, but required a lot of prompting.   Follow-up 06/29/2014:  At his last visit, neurontin 200mg  qhs and there was minimal and transient improvement.  The tingling is bothersome and has limited his activities because prolonged sitting makes it worse. He was found to have avascular necrosis of the left hip.  Cymbalta 30mg  and alpha lipoic acid 600mg  daily, but has not  noticed any change.  UPDATE 12/30/2014:  He is doing stable from a neuropathy stand point. His gabapentin was increased to 300mg  TID and he has notices mild improvement with his paresthesias.  His wife also says that he does not complain about his feet hurt as much, his biggest problem is with left hip pain.  He has had worsening of his renal function, which will hamper further titration of neurontin.  His left hip pain is most bothersome from AVN for which he is not a surgical candidate and leaves him in bed most of the day.  There has been a steady decline with memory, but nothing alarming.     Medications:  Current Outpatient Prescriptions on File Prior to Visit  Medication Sig Dispense Refill  . acetaminophen (TYLENOL) 500 MG tablet Take 500 mg by mouth every 6 (six) hours as needed. For pain    . aspirin EC 325 MG tablet Take 325 mg by mouth daily.    . cyanocobalamin 1000 MCG tablet Take 100 mcg by mouth daily.    Marland Kitchen donepezil (ARICEPT) 10 MG tablet Take 10 mg by mouth at bedtime.    . ferrous sulfate 325 (65 FE) MG tablet Take 325 mg by mouth daily with breakfast.    . gabapentin (NEURONTIN) 300 MG capsule Take 1 capsule (300 mg total) by mouth 2 (two) times daily. (Patient taking differently: Take 300 mg by mouth 3 (three) times daily. ) 60 capsule 5  . HYDROcodone-acetaminophen (NORCO/VICODIN) 5-325 MG per tablet Take 1-2 tablets by mouth every 6 (  six) hours as needed. For pain    . hydrocortisone (ANUSOL-HC) 25 MG suppository Place 25 mg rectally 2 (two) times daily as needed. To help with rectal bleeding    . lidocaine (XYLOCAINE) 5 % ointment Apply 1 application topically 3 (three) times daily as needed. Apply with gloves. 50 g 3  . metoprolol tartrate (LOPRESSOR) 25 MG tablet Take 12.5 mg by mouth 2 (two) times daily.    . Multiple Vitamins-Minerals (MULTIVITAMIN PO) Take 1 tablet by mouth daily.    . nitroGLYCERIN (NITROSTAT) 0.4 MG SL tablet Place 0.4 mg under the tongue every 5 (five)  minutes as needed. For chest pain    . olmesartan (BENICAR) 20 MG tablet Take 10 mg by mouth daily.    . RABEprazole (ACIPHEX) 20 MG tablet Take 20 mg by mouth daily.     No current facility-administered medications on file prior to visit.    Allergies:  Allergies  Allergen Reactions  . Haldol [Haloperidol Lactate]      Review of Systems:  CONSTITUTIONAL: No fevers, chills, night sweats, or weight loss.   EYES: No visual changes or eye pain ENT: No hearing changes.  No history of nose bleeds.   RESPIRATORY: No cough, wheezing and shortness of breath.   CARDIOVASCULAR: Negative for chest pain, and palpitations.   GI: Negative for abdominal discomfort, blood in stools or black stools.  No recent change in bowel habits.   GU:  No history of incontinence.   MUSCLOSKELETAL: +history of joint pain or swelling.  No myalgias.   SKIN: Negative for lesions, rash, and itching.   ENDOCRINE: Negative for cold or heat intolerance, polydipsia or goiter.   PSYCH:  No depression or anxiety symptoms.   NEURO: As Above.   Vital Signs:  BP 100/60 mmHg  Pulse 70  Ht 5\' 9"  (1.753 m)  Wt 187 lb 2 oz (84.879 kg)  BMI 27.62 kg/m2  SpO2 97%  Neurological Exam: MENTAL STATUS including orientation to time, place, person, recent and remote memory, attention span and concentration, language, and fund of knowledge is fair.   Speech is not dysarthric.  CRANIAL NERVES:  Pupils equal round and reactive to light.  Normal conjugate, extra-ocular eye movements in all directions of gaze.  No ptosis. Face is symmetric. Palate elevates symmetrically.  Tongue is midline.  MOTOR:  Motor strength is 5/5 in all extremities, except left hip flexion which is antigravity - motor strength not tested due to hip pain.  No pronator drift.  Tone is normal.    MSRs:  Reflexes are 2+/4 throughout, except absent Achilles bilaterally.     Data: Lab 04/05/2014:  Copper 101, MMA 110, ferritin 12* Labs 12/16/2013: Vitamin B12  358, TSH 2.27, SPEP - no M protein  Labs 05/19/2013: Na 140, K 4.7, Chl 109, Cr 1.24*, GFR 55*, Ca 8.8, AST 15, ALT 13   EMG 03/25/2014:  1. There is electrophysiological evidence of a generalized large fiber sensorimotor polyneuropathy affecting bilateral lower extremities.  2. There is also a superimposed old L5 radiculopathy predominantly affecting the right side, mild in degree electrically.   IMPRESSION/PLAN: 1.  Idiopathic peripheral neuropathy manifesting with bilateral feet painful paresthesias Clinically stable Continue gabapentin 300mg  TID, cymbalta 60mg , and lidocaine ointment prn If renal dysfunction worsens, may need to reduce gabapentin  2.  Possible superimposed restless leg syndrome Clinically improved since taking ferrous sulfate 325mg  daily, ferritin found to be low at 12  3.  Alzheimer's dementia Continue Aricept 10mg  daily Continue  namenda 10mg  BID  4.  Gait abnormalities due to left hip avascular necrosis, managed conservatively  5.  Chronic kidney disease  Return to clinic as needed   The duration of this appointment visit was 20 minutes of face-to-face time with the patient.  Greater than 50% of this time was spent in counseling, explanation of diagnosis, planning of further management, and coordination of care.   Thank you for allowing me to participate in patient's care.  If I can answer any additional questions, I would be pleased to do so.    Sincerely,    Donika K. Posey Pronto, DO

## 2014-12-30 NOTE — Progress Notes (Signed)
Note faxed.

## 2014-12-30 NOTE — Patient Instructions (Signed)
It was lovely to see you today. Continue your medications as you are taking them.   Please ask your primary care provider to send my the results of your labs, so I can decide if any changes do need to be made.

## 2015-03-29 DIAGNOSIS — G309 Alzheimer's disease, unspecified: Secondary | ICD-10-CM | POA: Diagnosis not present

## 2015-03-29 DIAGNOSIS — M87052 Idiopathic aseptic necrosis of left femur: Secondary | ICD-10-CM | POA: Diagnosis not present

## 2015-03-29 DIAGNOSIS — N179 Acute kidney failure, unspecified: Secondary | ICD-10-CM | POA: Diagnosis not present

## 2015-03-29 DIAGNOSIS — G609 Hereditary and idiopathic neuropathy, unspecified: Secondary | ICD-10-CM | POA: Diagnosis not present

## 2015-03-29 DIAGNOSIS — R630 Anorexia: Secondary | ICD-10-CM | POA: Diagnosis not present

## 2015-07-22 DIAGNOSIS — G309 Alzheimer's disease, unspecified: Secondary | ICD-10-CM | POA: Diagnosis not present

## 2015-07-22 DIAGNOSIS — I5020 Unspecified systolic (congestive) heart failure: Secondary | ICD-10-CM | POA: Diagnosis not present

## 2015-07-22 DIAGNOSIS — I10 Essential (primary) hypertension: Secondary | ICD-10-CM | POA: Diagnosis not present

## 2015-07-22 DIAGNOSIS — Z0001 Encounter for general adult medical examination with abnormal findings: Secondary | ICD-10-CM | POA: Diagnosis not present

## 2015-07-22 DIAGNOSIS — K219 Gastro-esophageal reflux disease without esophagitis: Secondary | ICD-10-CM | POA: Diagnosis not present

## 2015-07-22 DIAGNOSIS — E780 Pure hypercholesterolemia: Secondary | ICD-10-CM | POA: Diagnosis not present

## 2015-07-22 DIAGNOSIS — M546 Pain in thoracic spine: Secondary | ICD-10-CM | POA: Diagnosis not present

## 2015-07-22 DIAGNOSIS — M87052 Idiopathic aseptic necrosis of left femur: Secondary | ICD-10-CM | POA: Diagnosis not present

## 2015-07-22 DIAGNOSIS — I2510 Atherosclerotic heart disease of native coronary artery without angina pectoris: Secondary | ICD-10-CM | POA: Diagnosis not present

## 2015-07-22 DIAGNOSIS — Z1389 Encounter for screening for other disorder: Secondary | ICD-10-CM | POA: Diagnosis not present

## 2015-07-22 DIAGNOSIS — N183 Chronic kidney disease, stage 3 (moderate): Secondary | ICD-10-CM | POA: Diagnosis not present

## 2015-07-22 DIAGNOSIS — F329 Major depressive disorder, single episode, unspecified: Secondary | ICD-10-CM | POA: Diagnosis not present

## 2015-08-08 DIAGNOSIS — R269 Unspecified abnormalities of gait and mobility: Secondary | ICD-10-CM | POA: Diagnosis not present

## 2015-08-08 DIAGNOSIS — M87052 Idiopathic aseptic necrosis of left femur: Secondary | ICD-10-CM | POA: Diagnosis not present

## 2015-08-08 DIAGNOSIS — M79652 Pain in left thigh: Secondary | ICD-10-CM | POA: Diagnosis not present

## 2015-08-08 DIAGNOSIS — M25552 Pain in left hip: Secondary | ICD-10-CM | POA: Diagnosis not present

## 2015-08-25 DIAGNOSIS — Z23 Encounter for immunization: Secondary | ICD-10-CM | POA: Diagnosis not present

## 2016-01-26 DIAGNOSIS — I2510 Atherosclerotic heart disease of native coronary artery without angina pectoris: Secondary | ICD-10-CM | POA: Diagnosis not present

## 2016-01-26 DIAGNOSIS — N183 Chronic kidney disease, stage 3 (moderate): Secondary | ICD-10-CM | POA: Diagnosis not present

## 2016-01-26 DIAGNOSIS — M546 Pain in thoracic spine: Secondary | ICD-10-CM | POA: Diagnosis not present

## 2016-01-26 DIAGNOSIS — M87052 Idiopathic aseptic necrosis of left femur: Secondary | ICD-10-CM | POA: Diagnosis not present

## 2016-01-26 DIAGNOSIS — K219 Gastro-esophageal reflux disease without esophagitis: Secondary | ICD-10-CM | POA: Diagnosis not present

## 2016-01-26 DIAGNOSIS — F329 Major depressive disorder, single episode, unspecified: Secondary | ICD-10-CM | POA: Diagnosis not present

## 2016-01-26 DIAGNOSIS — I5020 Unspecified systolic (congestive) heart failure: Secondary | ICD-10-CM | POA: Diagnosis not present

## 2016-01-26 DIAGNOSIS — I10 Essential (primary) hypertension: Secondary | ICD-10-CM | POA: Diagnosis not present

## 2016-01-26 DIAGNOSIS — G309 Alzheimer's disease, unspecified: Secondary | ICD-10-CM | POA: Diagnosis not present

## 2016-08-02 DIAGNOSIS — G609 Hereditary and idiopathic neuropathy, unspecified: Secondary | ICD-10-CM | POA: Diagnosis not present

## 2016-08-02 DIAGNOSIS — Z1389 Encounter for screening for other disorder: Secondary | ICD-10-CM | POA: Diagnosis not present

## 2016-08-02 DIAGNOSIS — G629 Polyneuropathy, unspecified: Secondary | ICD-10-CM | POA: Diagnosis not present

## 2016-08-02 DIAGNOSIS — Z0000 Encounter for general adult medical examination without abnormal findings: Secondary | ICD-10-CM | POA: Diagnosis not present

## 2016-08-02 DIAGNOSIS — I5020 Unspecified systolic (congestive) heart failure: Secondary | ICD-10-CM | POA: Diagnosis not present

## 2016-08-02 DIAGNOSIS — I2510 Atherosclerotic heart disease of native coronary artery without angina pectoris: Secondary | ICD-10-CM | POA: Diagnosis not present

## 2016-08-02 DIAGNOSIS — M546 Pain in thoracic spine: Secondary | ICD-10-CM | POA: Diagnosis not present

## 2016-08-02 DIAGNOSIS — I10 Essential (primary) hypertension: Secondary | ICD-10-CM | POA: Diagnosis not present

## 2016-08-02 DIAGNOSIS — N183 Chronic kidney disease, stage 3 (moderate): Secondary | ICD-10-CM | POA: Diagnosis not present

## 2016-08-02 DIAGNOSIS — G309 Alzheimer's disease, unspecified: Secondary | ICD-10-CM | POA: Diagnosis not present

## 2016-08-02 DIAGNOSIS — M87052 Idiopathic aseptic necrosis of left femur: Secondary | ICD-10-CM | POA: Diagnosis not present

## 2016-08-02 DIAGNOSIS — F3341 Major depressive disorder, recurrent, in partial remission: Secondary | ICD-10-CM | POA: Diagnosis not present

## 2016-09-06 DIAGNOSIS — Z23 Encounter for immunization: Secondary | ICD-10-CM | POA: Diagnosis not present

## 2017-09-04 DIAGNOSIS — Z23 Encounter for immunization: Secondary | ICD-10-CM | POA: Diagnosis not present

## 2017-09-13 DIAGNOSIS — I10 Essential (primary) hypertension: Secondary | ICD-10-CM | POA: Diagnosis not present

## 2017-09-13 DIAGNOSIS — G629 Polyneuropathy, unspecified: Secondary | ICD-10-CM | POA: Diagnosis not present

## 2017-09-13 DIAGNOSIS — I2510 Atherosclerotic heart disease of native coronary artery without angina pectoris: Secondary | ICD-10-CM | POA: Diagnosis not present

## 2017-09-13 DIAGNOSIS — M87052 Idiopathic aseptic necrosis of left femur: Secondary | ICD-10-CM | POA: Diagnosis not present

## 2017-09-13 DIAGNOSIS — G309 Alzheimer's disease, unspecified: Secondary | ICD-10-CM | POA: Diagnosis not present

## 2017-09-13 DIAGNOSIS — Z0000 Encounter for general adult medical examination without abnormal findings: Secondary | ICD-10-CM | POA: Diagnosis not present

## 2017-09-13 DIAGNOSIS — I5020 Unspecified systolic (congestive) heart failure: Secondary | ICD-10-CM | POA: Diagnosis not present

## 2017-09-13 DIAGNOSIS — M546 Pain in thoracic spine: Secondary | ICD-10-CM | POA: Diagnosis not present

## 2017-09-13 DIAGNOSIS — E7800 Pure hypercholesterolemia, unspecified: Secondary | ICD-10-CM | POA: Diagnosis not present

## 2017-09-13 DIAGNOSIS — N183 Chronic kidney disease, stage 3 (moderate): Secondary | ICD-10-CM | POA: Diagnosis not present

## 2017-09-13 DIAGNOSIS — Z1389 Encounter for screening for other disorder: Secondary | ICD-10-CM | POA: Diagnosis not present

## 2017-09-13 DIAGNOSIS — F3341 Major depressive disorder, recurrent, in partial remission: Secondary | ICD-10-CM | POA: Diagnosis not present

## 2017-12-03 DIAGNOSIS — R410 Disorientation, unspecified: Secondary | ICD-10-CM | POA: Diagnosis not present

## 2017-12-12 DIAGNOSIS — G619 Inflammatory polyneuropathy, unspecified: Secondary | ICD-10-CM | POA: Diagnosis not present

## 2017-12-12 DIAGNOSIS — G309 Alzheimer's disease, unspecified: Secondary | ICD-10-CM | POA: Diagnosis not present

## 2017-12-12 DIAGNOSIS — I10 Essential (primary) hypertension: Secondary | ICD-10-CM | POA: Diagnosis not present

## 2017-12-12 DIAGNOSIS — I519 Heart disease, unspecified: Secondary | ICD-10-CM | POA: Diagnosis not present

## 2017-12-12 DIAGNOSIS — F339 Major depressive disorder, recurrent, unspecified: Secondary | ICD-10-CM | POA: Diagnosis not present

## 2017-12-12 DIAGNOSIS — K219 Gastro-esophageal reflux disease without esophagitis: Secondary | ICD-10-CM | POA: Diagnosis not present

## 2017-12-12 DIAGNOSIS — E785 Hyperlipidemia, unspecified: Secondary | ICD-10-CM | POA: Diagnosis not present

## 2017-12-12 DIAGNOSIS — I25810 Atherosclerosis of coronary artery bypass graft(s) without angina pectoris: Secondary | ICD-10-CM | POA: Diagnosis not present

## 2017-12-12 DIAGNOSIS — Z85528 Personal history of other malignant neoplasm of kidney: Secondary | ICD-10-CM | POA: Diagnosis not present

## 2017-12-12 DIAGNOSIS — I509 Heart failure, unspecified: Secondary | ICD-10-CM | POA: Diagnosis not present

## 2017-12-13 DIAGNOSIS — G619 Inflammatory polyneuropathy, unspecified: Secondary | ICD-10-CM | POA: Diagnosis not present

## 2017-12-13 DIAGNOSIS — I10 Essential (primary) hypertension: Secondary | ICD-10-CM | POA: Diagnosis not present

## 2017-12-13 DIAGNOSIS — I519 Heart disease, unspecified: Secondary | ICD-10-CM | POA: Diagnosis not present

## 2017-12-13 DIAGNOSIS — I25810 Atherosclerosis of coronary artery bypass graft(s) without angina pectoris: Secondary | ICD-10-CM | POA: Diagnosis not present

## 2017-12-13 DIAGNOSIS — E785 Hyperlipidemia, unspecified: Secondary | ICD-10-CM | POA: Diagnosis not present

## 2017-12-13 DIAGNOSIS — I509 Heart failure, unspecified: Secondary | ICD-10-CM | POA: Diagnosis not present

## 2017-12-14 DIAGNOSIS — I509 Heart failure, unspecified: Secondary | ICD-10-CM | POA: Diagnosis not present

## 2017-12-14 DIAGNOSIS — I10 Essential (primary) hypertension: Secondary | ICD-10-CM | POA: Diagnosis not present

## 2017-12-14 DIAGNOSIS — G619 Inflammatory polyneuropathy, unspecified: Secondary | ICD-10-CM | POA: Diagnosis not present

## 2017-12-14 DIAGNOSIS — I25810 Atherosclerosis of coronary artery bypass graft(s) without angina pectoris: Secondary | ICD-10-CM | POA: Diagnosis not present

## 2017-12-14 DIAGNOSIS — E785 Hyperlipidemia, unspecified: Secondary | ICD-10-CM | POA: Diagnosis not present

## 2017-12-14 DIAGNOSIS — I519 Heart disease, unspecified: Secondary | ICD-10-CM | POA: Diagnosis not present

## 2017-12-27 DEATH — deceased
